# Patient Record
Sex: Male | Born: 1985 | Race: Black or African American | Hispanic: No | Marital: Single | Smoking: Current every day smoker
Health system: Southern US, Community
[De-identification: ages and names within clinical notes are randomized; demographics above are authoritative.]

## PROBLEM LIST (undated history)

## (undated) DIAGNOSIS — E669 Obesity, unspecified: Secondary | ICD-10-CM

## (undated) DIAGNOSIS — I480 Paroxysmal atrial fibrillation: Secondary | ICD-10-CM

## (undated) DIAGNOSIS — K219 Gastro-esophageal reflux disease without esophagitis: Secondary | ICD-10-CM

## (undated) HISTORY — DX: Obesity, unspecified: E66.9

## (undated) HISTORY — PX: OTHER SURGICAL HISTORY: SHX169

## (undated) HISTORY — DX: Paroxysmal atrial fibrillation: I48.0

## (undated) HISTORY — DX: Gastro-esophageal reflux disease without esophagitis: K21.9

---

## 2012-11-22 ENCOUNTER — Emergency Department
Admission: EM | Admit: 2012-11-22 | Disposition: A | Discharge status: Home / Self Care | Payer: Self-pay | Source: Ambulatory Visit | Attending: Emergency Medicine | Admitting: Emergency Medicine

## 2012-11-22 ENCOUNTER — Encounter: Payer: Self-pay | Admitting: Emergency Medicine

## 2012-11-22 MED ORDER — IBUPROFEN 400 MG PO TABS *I*
800.0000 mg | ORAL_TABLET | Freq: Once | ORAL | Status: AC
Start: 2012-11-22 — End: 2012-11-22
  Administered 2012-11-22: 800 mg via ORAL
  Filled 2012-11-22: qty 2

## 2012-11-22 MED ORDER — HYDROCODONE-ACETAMINOPHEN 5-325 MG PO TABS *I*
1.0000 | ORAL_TABLET | Freq: Once | ORAL | Status: AC
Start: 2012-11-22 — End: 2012-11-22
  Administered 2012-11-22: 1 via ORAL
  Filled 2012-11-22: qty 1

## 2012-11-22 NOTE — ED Notes (Signed)
Affirms triage note, Left leg "gave out."  Ambulatory post incident, but presents today bc of significant swelling and pain.  Ambulatory with a limp.  LLE weaker than unaffected side with flexion extension, and edema around entire knee, tender on both medial and lateral aspects.  Patella in place.   Remote Hx of prior L knee injury from basketball, but was Dx as "sprain."  Medicated and stat to XRay.

## 2012-11-22 NOTE — Discharge Instructions (Signed)
You were seen for a left knee twisting injury. There is no fracture, however, it is possible that there is a ligament or meniscus injury. Follow-up with your primary care physician, or sports medicine in one week for re-evaluation.

## 2012-11-22 NOTE — ED Notes (Signed)
Walking to work when he slipped and fell injuring L knee, able to walk but limping. TTP, swollen

## 2012-11-22 NOTE — ED Provider Notes (Addendum)
History     Chief Complaint   Patient presents with   . Knee Pain     HPI Comments: 27 yo M presents with left knee pain and swelling after a knee twisting injury while walking to work. He has been able to ambulate, but only several steps. He denies weakness, numbness. He denies other injury.      History reviewed. No pertinent past medical history.         History reviewed. No pertinent past surgical history.    No family history on file.      Social History      has no tobacco, alcohol, drug, and sexual activity histories on file.    Living Situation    Questions Responses    Patient lives with     Homeless No    Caregiver for other family member     External Services     Employment     Domestic Violence Risk           Review of Systems   Review of Systems   Musculoskeletal:        As in HPI   Skin: Negative for color change and wound.   Neurological: Negative for weakness and numbness.       Physical Exam     ED Triage Vitals   BP Heart Rate Heart Rate(via Pulse Ox) Resp Temp Temp Source SpO2 O2 Device O2 Flow Rate   11/22/12 1945 11/22/12 1945 11/22/12 1945 11/22/12 1945 11/22/12 1945 11/22/12 1945 11/22/12 1945 11/22/12 1945 --   156/91 mmHg 98 98 18 36.3 C (97.3 F) Tympanic 100 % None (Room air)       Weight           11/22/12 1945           104.327 kg (230 lb)               Physical Exam   Nursing note and vitals reviewed.  Constitutional: He is oriented to person, place, and time. He appears well-developed and well-nourished.   HENT:   Head: Normocephalic and atraumatic.   Eyes: Pupils are equal, round, and reactive to light.   Neck: Normal range of motion.   Musculoskeletal:   Left knee with limited ROM secondary to discomfort and obvious knee effusion. Generalized tenderness. No obvious joint laxity.   Neurological: He is alert and oriented to person, place, and time.   Normal sensation and strength   Skin: Skin is warm and dry.       Medical Decision Making      Amount and/or Complexity of Data  Reviewed  Tests in the radiology section of CPT: reviewed and ordered        Initial Evaluation:  ED First Provider Contact    Date/Time Event User Comments    11/22/12 1946 ED Provider First Contact [facility], CYNTHIA M Initial Face to Face Provider Contact          Patient seen by me today 12/14/2012 at 2059    Assessment:  27 y.o., male comes to the ED with left knee pain, effusion, decreased ROM after a twisting injury while walking to work. He otherwise appears well.    Differential Diagnosis includes sprain/strain, doubt fracture, possible ligament or meniscus injury              Plan: xray left knee, PO percocet; plan for knee immobilizer and crutches; sports medicine f/u.      Victorino Dike,  MD    Victorino Dike, MD  Resident  11/22/12 [phone]      Resident Attestation:     Patient seen by me on arrival date of 12/14/2012 at approx 2130    History:   I reviewed this patient, reviewed the resident's note and agree.  Exam:   I examined this patient, reviewed the resident's note and agree.    Decision Making:   I discussed with the resident his/her documented decision making  and agree.      Author Evlyn Kanner, MD      Evlyn Kanner, MD  11/22/12 [phone]

## 2012-11-22 NOTE — First Provider Contact (Signed)
ED Medical Screening Exam Note    Initial provider evaluation performed by   ED First Provider Contact    Date/Time Event User Comments    11/22/12 1946 ED Provider First Contact [facility], Erva Koke M Initial Face to Face Provider Contact            While walking to work injured left knee    XRAYS and ANALGESIA ordered.      Patient requires further evaluation.     Alonzo Loving MARIE [place], NP, 11/22/2012, 7:46 PM

## 2012-12-01 ENCOUNTER — Ambulatory Visit: Payer: Self-pay

## 2013-04-25 ENCOUNTER — Emergency Department
Admission: EM | Admit: 2013-04-25 | Disposition: A | Discharge status: Home / Self Care | Payer: Self-pay | Source: Ambulatory Visit

## 2013-04-25 ENCOUNTER — Encounter: Payer: Self-pay | Admitting: Emergency Medicine

## 2013-04-25 LAB — RAPID STREP SCREEN: Rapid Strep Group A Throat: POSITIVE

## 2013-04-25 MED ORDER — ACETAMINOPHEN-CODEINE 300-30 MG PO TABS *I*
1.0000 | ORAL_TABLET | ORAL | Status: DC | PRN
Start: 2013-04-25 — End: 2018-08-04

## 2013-04-25 MED ORDER — IBUPROFEN 400 MG PO TABS *I*
600.0000 mg | ORAL_TABLET | Freq: Once | ORAL | Status: AC
Start: 2013-04-25 — End: 2013-04-25
  Administered 2013-04-25: 600 mg via ORAL
  Filled 2013-04-25 (×2): qty 1

## 2013-04-25 MED ORDER — LIDOCAINE VISCOUS 2 % MT SOLN *I*
10.0000 mL | Freq: Once | OROMUCOSAL | Status: AC
Start: 2013-04-25 — End: 2013-04-25
  Administered 2013-04-25: 10 mL via OROMUCOSAL
  Filled 2013-04-25: qty 100

## 2013-04-25 MED ORDER — ACETAMINOPHEN-CODEINE 300-30 MG PO TABS *I*
1.0000 | ORAL_TABLET | Freq: Once | ORAL | Status: AC
Start: 2013-04-25 — End: 2013-04-25
  Administered 2013-04-25: 1 via ORAL
  Filled 2013-04-25: qty 1

## 2013-04-25 MED ORDER — PENICILLIN V POTASSIUM 500 MG PO TABS *I*
500.0000 mg | ORAL_TABLET | Freq: Once | ORAL | Status: AC
Start: 2013-04-25 — End: 2013-04-25
  Administered 2013-04-25: 500 mg via ORAL
  Filled 2013-04-25: qty 1

## 2013-04-25 MED ORDER — PENICILLIN V POTASSIUM 500 MG PO TABS *I*
500.0000 mg | ORAL_TABLET | Freq: Two times a day (BID) | ORAL | Status: DC
Start: 2013-04-25 — End: 2018-08-04

## 2013-04-25 NOTE — First Provider Contact (Signed)
ED Medical Screening Exam Note    Initial provider evaluation performed by   ED First Provider Contact    Date/Time Event User Comments    04/25/13 1025 ED Provider First Contact Tericka Devincenzi Initial Face to Face Provider Contact      28 yo male presents with a 2 day history of a sore throat. Denies fever, chills, shortness of breath. Handling secretions. Throat reddened, uvula midline; no exudate notes.         LABS and ANALGESIA ordered.      Patient requires further evaluation.     Conchita Paris, NP, 04/25/2013, 10:25 AM

## 2013-04-25 NOTE — ED Notes (Signed)
PT presented to ED via ambulatory triage. Yesterday had trouble swallowing. Pt states got worse this morning. Has chills. Denies n/v blurred vision and dizziness. NAD.

## 2013-04-25 NOTE — Discharge Instructions (Signed)
Take medicine as prescribed  Follow up with PCP within 5 to 7 days    Don't drink alcohol and/or drive while on medicine  Drink plenty of fluids  Return to ED if you are having increase pain, no improvement and.or worsening symptoms  If you do not have a primary care doctor, the following physicians are currently accepting new patients:    [facility]  [address], [address]  [phone]  Fulton Mole, DO ([place])      [place] [facility]    [facility]  [address]  [phone]  Ulla Potash, MD (IM/Peds)               [facility] (continued)    [facility]  [facility])  [address], [address]  [phone]  Sorour Arnetha Massy, MD (IM)    [facility]  [address] [place]  [phone]  Call for a list of accepting physicians    [facility]  [address] [facility]  Ambulatory Care Facility - 5th floor  [phone]  Call for a list of accepting physicians    [facility]  [address]  [phone]  Jory Sims, MD (IM)    [facility]  [address]  [phone]  Valeta Harms, MD (IM/Peds)     [facility]   NEW Locations:    [facility]  [address] [place], [address]  [phone]  Stoney Bang, MD (FM/OB)  Isaac Laud, MD [facility])  Lorin Picket. Orvan Falconer, MD (FM)               FM: Family Medicine .  IM: Internal Medicine . IM/Peds: Internal Medicine & Pediatrics  [facility]    For physician referral services, please call [phone] or visit [employer]

## 2013-04-25 NOTE — ED Notes (Signed)
Sore throat since yesterday, painful to swallow. In NAD

## 2013-04-25 NOTE — ED Provider Notes (Signed)
History     Chief Complaint   Patient presents with   . Sore Throat     HPI Comments: 27 year old male with no pertinent PMH presents the the ED with 2 days of sore throat and painful swallowing.  Pt reports that yesterday Am he woke up with a sore throat. It has gotten worse so he came in to the ED today. He has pain when her swallows anything, but he can swallow secretions fine.  He rep(orts he feels his throat is more swollen today. He is not having trouble breathing but is less restricted when he breaths through his nose. He denies nasal congestion or post nasal drip.  He was not around any shell fish yesterday.  She reports a fever and chills over the last two days. Denies SOB, chest pian, visual changes, or sublingual swelling.  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         No past surgical history on file.    No family history on file.      Social History      has no tobacco, alcohol, drug, and sexual activity history on file.    Living Situation    Questions Responses    Patient lives with     Homeless No    Caregiver for other family member     External Services     Employment     Domestic Violence Risk           Problem List      does not have a problem list on file.    Review of Systems   Review of Systems   Constitutional: Positive for fever, chills, appetite change and fatigue. Negative for diaphoresis.   HENT: Positive for sore throat and neck pain. Negative for hearing loss, nosebleeds, congestion, drooling, trouble swallowing, neck stiffness, postnasal drip, sinus pressure and tinnitus.    Eyes: Negative for photophobia and redness.   Respiratory: Negative for cough, choking, chest tightness, shortness of breath and stridor.    Cardiovascular: Negative for chest pain, palpitations and leg swelling.    Gastrointestinal: Negative for nausea, vomiting, abdominal pain, diarrhea and blood in stool.   Genitourinary: Negative for urgency, frequency and hematuria.   Musculoskeletal: Negative for back pain, arthralgias and gait problem.   Skin: Negative for color change, pallor, rash and wound.   Allergic/Immunologic: Positive for food allergies.   Neurological: Negative for syncope, speech difficulty, weakness, light-headedness, numbness and headaches.   Psychiatric/Behavioral: Negative.        Physical Exam     ED Triage Vitals   BP Heart Rate Heart Rate(via Pulse Ox) Resp Temp Temp src SpO2 O2 Device O2 Flow Rate   04/25/13 1000 04/25/13 1000 -- 04/25/13 1000 04/25/13 1000 -- 04/25/13 1000 -- --   144/80 mmHg 90  16 37.9 C (100.2 F)  96 %        Weight           --                          Physical Exam   Nursing note and vitals reviewed.  Constitutional: He  is oriented to person, place, and time. Vital signs are normal. He appears well-developed and well-nourished. No distress.   HENT:   Head: Normocephalic and atraumatic. No trismus in the jaw.   Right Ear: External ear normal.   Left Ear: External ear normal.   Mouth/Throat: Uvula is midline. Edematous present. Posterior oropharyngeal erythema present. No oropharyngeal exudate.   Uvula swollen, tonsillar exudates noted bilaterally. oropharynx is erythematous an swollen    Eyes: Conjunctivae and EOM are normal. Pupils are equal, round, and reactive to light. Right eye exhibits no discharge. Left eye exhibits no discharge.   Neck: Normal range of motion. No JVD present. No tracheal deviation present.   Cardiovascular: Normal rate, regular rhythm, normal heart sounds and normal pulses.    No murmur heard.  Pulmonary/Chest: Effort normal and breath sounds normal. No respiratory distress. He exhibits no tenderness.   Abdominal: Soft. Bowel sounds are normal.   Musculoskeletal: Normal range of motion. He exhibits no edema and no tenderness.   Lymphadenopathy:     He  has cervical adenopathy.   Neurological: He is alert and oriented to person, place, and time. He has normal reflexes.   Skin: Skin is warm and dry. No rash noted. No erythema. No pallor.   Psychiatric: He has a normal mood and affect. His behavior is normal. Judgment and thought content normal.       Medical Decision Making      Amount and/or Complexity of Data Reviewed  Clinical lab tests: ordered and reviewed        Initial Evaluation:  ED First Provider Contact    Date/Time Event User Comments    04/25/13 1025 ED Provider First Contact DOYLE, MARGARET Initial Face to Face Provider Contact          Patient seen by me today 04/25/2013 at at time of arrival  1350  Initial face to face evaluation time noted above may be discrepant due to patient acuity and delay in documentation.    Assessment:  27 y.o., male comes to the ED with sore throat x 2 day    Differential Diagnosis includes Strep pharyngitis, viral pharyngitis,               Plan:pain medicine. Abx, dc home  Rapid strep        Alba Cory, PA    Supervising physician Dr.scurek was immediately available      Alba Cory, [place]  04/25/13 1404

## 2016-01-31 ENCOUNTER — Ambulatory Visit
Admission: RE | Admit: 2016-01-31 | Discharge: 2016-01-31 | Disposition: A | Discharge status: Home / Self Care | Payer: Self-pay | Source: Ambulatory Visit | Attending: Psychiatry | Admitting: Psychiatry

## 2016-05-29 ENCOUNTER — Other Ambulatory Visit
Admission: RE | Admit: 2016-05-29 | Discharge: 2016-05-29 | Disposition: A | Discharge status: Home / Self Care | Payer: Self-pay | Source: Ambulatory Visit | Attending: Psychiatry | Admitting: Psychiatry

## 2016-07-07 ENCOUNTER — Other Ambulatory Visit
Admission: RE | Admit: 2016-07-07 | Discharge: 2016-07-07 | Disposition: A | Discharge status: Home / Self Care | Payer: Self-pay | Source: Ambulatory Visit | Admitting: Psychiatry

## 2016-07-22 ENCOUNTER — Other Ambulatory Visit
Admission: RE | Admit: 2016-07-22 | Discharge: 2016-07-22 | Disposition: A | Discharge status: Home / Self Care | Payer: Self-pay | Source: Ambulatory Visit | Admitting: Psychiatry

## 2016-08-04 ENCOUNTER — Other Ambulatory Visit
Admission: RE | Admit: 2016-08-04 | Discharge: 2016-08-04 | Disposition: A | Discharge status: Home / Self Care | Payer: Self-pay | Source: Ambulatory Visit | Admitting: Psychiatry

## 2016-08-11 ENCOUNTER — Other Ambulatory Visit
Admission: RE | Admit: 2016-08-11 | Discharge: 2016-08-11 | Disposition: A | Discharge status: Home / Self Care | Payer: Self-pay | Source: Ambulatory Visit | Admitting: Psychiatry

## 2016-08-18 ENCOUNTER — Other Ambulatory Visit
Admission: RE | Admit: 2016-08-18 | Discharge: 2016-08-18 | Disposition: A | Discharge status: Home / Self Care | Payer: Self-pay | Source: Ambulatory Visit | Admitting: Psychiatry

## 2016-08-19 ENCOUNTER — Other Ambulatory Visit
Admission: RE | Admit: 2016-08-19 | Discharge: 2016-08-19 | Disposition: A | Discharge status: Home / Self Care | Payer: Self-pay | Source: Ambulatory Visit | Admitting: Psychiatry

## 2016-08-25 ENCOUNTER — Other Ambulatory Visit
Admission: RE | Admit: 2016-08-25 | Discharge: 2016-08-25 | Disposition: A | Discharge status: Home / Self Care | Payer: Self-pay | Source: Ambulatory Visit | Admitting: Psychiatry

## 2016-08-26 ENCOUNTER — Other Ambulatory Visit
Admission: RE | Admit: 2016-08-26 | Discharge: 2016-08-26 | Disposition: A | Discharge status: Home / Self Care | Payer: Self-pay | Source: Ambulatory Visit | Admitting: Psychiatry

## 2016-09-01 ENCOUNTER — Other Ambulatory Visit
Admission: RE | Admit: 2016-09-01 | Discharge: 2016-09-01 | Disposition: A | Discharge status: Home / Self Care | Payer: Self-pay | Source: Ambulatory Visit | Admitting: Psychiatry

## 2016-09-09 ENCOUNTER — Other Ambulatory Visit
Admission: RE | Admit: 2016-09-09 | Discharge: 2016-09-09 | Disposition: A | Discharge status: Home / Self Care | Payer: Self-pay | Source: Ambulatory Visit | Admitting: Psychiatry

## 2016-09-15 ENCOUNTER — Other Ambulatory Visit
Admission: RE | Admit: 2016-09-15 | Discharge: 2016-09-15 | Disposition: A | Discharge status: Home / Self Care | Payer: Self-pay | Source: Ambulatory Visit | Admitting: Psychiatry

## 2016-09-22 ENCOUNTER — Other Ambulatory Visit
Admission: RE | Admit: 2016-09-22 | Discharge: 2016-09-22 | Disposition: A | Discharge status: Home / Self Care | Payer: Self-pay | Source: Ambulatory Visit | Admitting: Psychiatry

## 2016-09-29 ENCOUNTER — Other Ambulatory Visit
Admission: RE | Admit: 2016-09-29 | Discharge: 2016-09-29 | Disposition: A | Discharge status: Home / Self Care | Payer: Self-pay | Source: Ambulatory Visit | Admitting: Psychiatry

## 2016-10-06 ENCOUNTER — Other Ambulatory Visit
Admission: RE | Admit: 2016-10-06 | Discharge: 2016-10-06 | Disposition: A | Discharge status: Home / Self Care | Payer: Self-pay | Source: Ambulatory Visit | Admitting: Psychiatry

## 2016-10-14 ENCOUNTER — Other Ambulatory Visit
Admission: RE | Admit: 2016-10-14 | Discharge: 2016-10-14 | Disposition: A | Discharge status: Home / Self Care | Payer: Self-pay | Source: Ambulatory Visit | Admitting: Psychiatry

## 2017-09-25 ENCOUNTER — Other Ambulatory Visit: Payer: Self-pay | Admitting: Cardiology

## 2017-09-25 ENCOUNTER — Emergency Department
Admission: EM | Admit: 2017-09-25 | Discharge: 2017-09-25 | Disposition: A | Discharge status: Home / Self Care | Payer: No Typology Code available for payment source | Source: Ambulatory Visit | Attending: Emergency Medicine | Admitting: Emergency Medicine

## 2017-09-25 ENCOUNTER — Encounter: Payer: Self-pay | Admitting: Emergency Medicine

## 2017-09-25 DIAGNOSIS — R0789 Other chest pain: Secondary | ICD-10-CM

## 2017-09-25 DIAGNOSIS — F17210 Nicotine dependence, cigarettes, uncomplicated: Secondary | ICD-10-CM | POA: Insufficient documentation

## 2017-09-25 DIAGNOSIS — R002 Palpitations: Secondary | ICD-10-CM | POA: Insufficient documentation

## 2017-09-25 DIAGNOSIS — R000 Tachycardia, unspecified: Secondary | ICD-10-CM

## 2017-09-25 DIAGNOSIS — F1290 Cannabis use, unspecified, uncomplicated: Secondary | ICD-10-CM | POA: Insufficient documentation

## 2017-09-25 LAB — CBC AND DIFFERENTIAL
Baso # K/uL: 0.1 10*3/uL (ref 0.0–0.1)
Basophil %: 1.2 %
Eos # K/uL: 0.4 10*3/uL (ref 0.0–0.5)
Eosinophil %: 3.9 %
Hematocrit: 44 % (ref 40–51)
Hemoglobin: 15 g/dL (ref 13.7–17.5)
IMM Granulocytes #: 0 10*3/uL (ref 0.0–0.1)
IMM Granulocytes: 0.3 %
Lymph # K/uL: 2.6 10*3/uL (ref 1.3–3.6)
Lymphocyte %: 28.7 %
MCH: 30 pg (ref 26–32)
MCHC: 34 g/dL (ref 32–37)
MCV: 87 fL (ref 79–92)
Mono # K/uL: 0.7 10*3/uL (ref 0.3–0.8)
Monocyte %: 7.9 %
Neut # K/uL: 5.3 10*3/uL (ref 1.8–5.4)
Nucl RBC # K/uL: 0 10*3/uL (ref 0.0–0.0)
Nucl RBC %: 0 /100 WBC (ref 0.0–0.2)
Platelets: 295 10*3/uL (ref 150–330)
RBC: 5 MIL/uL (ref 4.6–6.1)
RDW: 14 % (ref 11.6–14.4)
Seg Neut %: 58 %
WBC: 9.1 10*3/uL (ref 4.2–9.1)

## 2017-09-25 LAB — BASIC METABOLIC PANEL
Anion Gap: 11 (ref 7–16)
CO2: 25 mmol/L (ref 20–28)
Calcium: 9 mg/dL (ref 9.0–10.3)
Chloride: 102 mmol/L (ref 96–108)
Creatinine: 0.97 mg/dL (ref 0.67–1.17)
GFR,Black: 118 *
GFR,Caucasian: 102 *
Glucose: 116 mg/dL — ABNORMAL HIGH (ref 60–99)
Lab: 12 mg/dL (ref 6–20)
Potassium: 3.6 mmol/L (ref 3.3–5.1)
Sodium: 138 mmol/L (ref 133–145)

## 2017-09-25 LAB — MAGNESIUM: Magnesium: 1.5 mEq/L (ref 1.3–2.1)

## 2017-09-25 NOTE — ED Provider Notes (Signed)
History     Chief Complaint   Patient presents with    Palpitations     HPI Comments: Patient is a 31 y/o male with no known PMH who presents today with concern of palpitations.  He states that just prior to arrival, he smoked some marijuana and stood up abruptly after smoking this.  He states that that time, he felt lightheaded as well as noted some palpitations.  He also noted some burning type chest discomfort.  He states that all of his symptoms resolved as soon as he sat down.  He denies any similar symptoms in the past.  He denies any associated diaphoresis, nausea, shortness of breath, chest pressure, radiation of pain, syncope, fall, injury or trauma, fevers, chills, visual changes, auditory changes, numbness, tingling, or headaches.  He states he has not taken anything for his symptoms and they self resolved.  Patient denies any personal or family history of cardiac events.      History provided by:  Patient  Language interpreter used: No        Medical/Surgical/Family History     History reviewed. No pertinent past medical history.     There is no problem list on file for this patient.           History reviewed. No pertinent surgical history.  History reviewed. No pertinent family history.       Social History   Substance Use Topics    Smoking status: Current Every Day Smoker    Smokeless tobacco: Never Used    Alcohol use No     Living Situation     Questions Responses    Patient lives with Spouse    Homeless No    Caregiver for other family member     External Services     Employment Disabled    Domestic Violence Risk                 Review of Systems   Review of Systems   Constitutional: Negative for activity change, appetite change, chills, diaphoresis, fatigue and fever.   HENT: Negative for congestion, ear pain, rhinorrhea, sinus pressure, sneezing and sore throat.    Eyes: Negative for visual disturbance.   Respiratory: Negative for apnea, cough, choking, chest tightness and shortness of  breath.    Cardiovascular: Negative for chest pain (Burning discomfort, resolved), palpitations (Resolved) and leg swelling.   Gastrointestinal: Negative for abdominal distention, abdominal pain, constipation, diarrhea, nausea and vomiting.   Genitourinary: Negative for difficulty urinating, dysuria, flank pain, frequency, hematuria and urgency.   Musculoskeletal: Negative for neck pain and neck stiffness.   Skin: Negative for color change, pallor, rash and wound.   Neurological: Negative for dizziness, tremors, syncope, weakness, light-headedness (Resolved), numbness and headaches.       Physical Exam     Triage Vitals  Triage Start: Start, (09/25/17 2120)   First Recorded BP: 165/84, Resp: 16, Temp: 37.2 C (99 F) Oxygen Therapy SpO2: 97 %, O2 Device: None (Room air),   Heart Rate (via Pulse Ox): (!) 124, (09/25/17 2122).  First Pain Reported  0-10 Scale: 6, (09/25/17 2122)       Physical Exam   Constitutional: He is oriented to person, place, and time. He appears well-developed. No distress.   HENT:   Head: Normocephalic and atraumatic.   Right Ear: External ear normal.   Left Ear: External ear normal.   Nose: Nose normal.   Eyes: Conjunctivae and EOM are normal.  Neck: Normal range of motion. Neck supple.   Cardiovascular: Normal rate, regular rhythm, normal heart sounds and intact distal pulses.  Exam reveals no gallop and no friction rub.    No murmur heard.  Pulmonary/Chest: Effort normal and breath sounds normal. No respiratory distress. He has no wheezes. He has no rales. He exhibits no tenderness.   Abdominal: Soft. Bowel sounds are normal. He exhibits no distension and no mass. There is no tenderness. There is no rebound and no guarding.   Musculoskeletal: Normal range of motion. He exhibits no edema, tenderness or deformity.   Neurological: He is alert and oriented to person, place, and time. No cranial nerve deficit. He exhibits normal muscle tone. Coordination normal.   Skin: Skin is warm and dry. No  rash noted. He is not diaphoretic. No erythema. No pallor.   Psychiatric: He has a normal mood and affect. His behavior is normal. Judgment and thought content normal.   Nursing note and vitals reviewed.      Medical Decision Making      Amount and/or Complexity of Data Reviewed  Clinical lab tests: ordered and reviewed  Decide to obtain previous medical records or to obtain history from someone other than the patient: yes  Review and summarize past medical records: yes  Independent visualization of images, tracings, or specimens: yes        Initial Evaluation:  ED First Provider Contact     Date/Time Event User Comments    09/25/17 2220 ED First Provider Contact Theressa MillardGARTH, [facility] Initial Face to Face Provider Contact          Patient seen by me on arrival date of 09/25/2017.    Assessment:  30 y.o.male comes to the ED with concern of an episode of lightheadedness and palpitations associated with a burning in the middle of his chest which occurred after he stood up quickly after smoking marijuana.  No other associated symptoms at the time, and no ongoing symptoms.  On exam, there are no significant abnormal findings.  Initially, the patient was tachycardic, but at exam, the patient's heart rate was in the low 80s.  No tenderness with palpation of any portion of the patient's anterior chest wall.      Differential Diagnosis includes: Orthostatic hypotension, drug reaction, GERD, unlikely PE/ACS        Plan: Diagnostic:   Labs Reviewed   BASIC METABOLIC PANEL - Abnormal; Notable for the following:        Result Value    Glucose 116 (*)     All other components within normal limits   CBC AND DIFFERENTIAL   MAGNESIUM     I have discussed the option of both obtaining further testing including imaging as well as foregoing this, as well as the benefits and risks of either decision.  I state that I can not definitely exclude any underlying abnormalities without further testing.  Patient verbalizes understanding of risks and  benefits of both decisions and does not wish to pursue further assessment.       Therapeutic: None indicated.  Patient states that he is feeling well with no symptoms.    I discussed the results with the patient.  I addressed all questions and concerns.  The patient verbalized understanding and agreement with outpatient conservative management and close follow up with PCP.  Reviewed strict return precautions.     I have reassessed this patient several times during time in the ED.    Plan to discharge pt  home with instructions to follow-up with PCP  Pt was instructed to return to the ED with worsening symptoms or concerns   Pt is agreeable with this plan and understands return precautions     This document was dictated with Dragon voice recognition software. Please excuse any errors as the document was typeread to the best of my ability.        Lynann Bologna, PA    Supervising physician [employer] was immediately available        Lynann Bologna, [place]  09/25/17 2333

## 2017-09-25 NOTE — ED Triage Notes (Signed)
Patient reports having left sided chest discomfort and it feels like his heart is racing denies any nausea denies any SOB.       Triage Note   Marney Settinganielle Zarria Towell, RN

## 2017-09-25 NOTE — ED Notes (Signed)
Pt to ED for L sided chest pain that started 20 min PTA., pt reports pain being intermittent and not experiencing pain at this moment however feels discomfort, denies pain effecting ability to breathe or feel SOB. Pt reports smoking weed awhile ago and states " hope i'm not just high thinking something is wrong". Pt having difficulty describing quality and nature of pain, stating "I guess it feels like pressure". Pt placed on tele, vitals stable.

## 2017-09-25 NOTE — Discharge Instructions (Signed)
You were seen in Emergency Department today for assessment; all labs reassuring.    Please continue to closely monitor your symptoms and ensure that you are staying well hydrated.     You need to follow up with your primary care physician, please call within 48 hours to schedule an appointment to be seen in 3-5 days.     Return to the Emergency Department if your condition worsens.    If you were given narcotic pain medication today or any medication that made you feel drowsy, do not drive or operate machinery for 24 hours.

## 2017-09-29 LAB — EKG 12-LEAD
P: 43 deg
PR: 156 ms
QRS: 30 deg
QRSD: 86 ms
QT: 312 ms
QTc: 426 ms
Rate: 112 {beats}/min
Severity: ABNORMAL
Statement: BORDERLINE
T: 31 deg

## 2018-07-01 ENCOUNTER — Other Ambulatory Visit: Payer: Self-pay | Admitting: Cardiology

## 2018-07-01 DIAGNOSIS — F17210 Nicotine dependence, cigarettes, uncomplicated: Secondary | ICD-10-CM | POA: Insufficient documentation

## 2018-07-01 DIAGNOSIS — R0789 Other chest pain: Secondary | ICD-10-CM

## 2018-07-01 DIAGNOSIS — R079 Chest pain, unspecified: Secondary | ICD-10-CM | POA: Insufficient documentation

## 2018-07-01 NOTE — ED Triage Notes (Signed)
EKG done at triage.        Triage Note   Rydell Wiegel, RN

## 2018-07-01 NOTE — ED Triage Notes (Signed)
Pt has left sided chest pain  "Like someone is sitting on my chest "radiating to left arm  X 8 hours. No nausea.        Triage Note   Jennefer BravoSuzanne Jaimie Redditt, RN

## 2018-07-02 ENCOUNTER — Emergency Department
Admission: EM | Admit: 2018-07-02 | Discharge: 2018-07-02 | Disposition: A | Discharge status: Home / Self Care | Payer: No Typology Code available for payment source | Source: Ambulatory Visit | Attending: Emergency Medicine | Admitting: Emergency Medicine

## 2018-07-02 ENCOUNTER — Encounter: Payer: Self-pay | Admitting: Emergency Medicine

## 2018-07-02 ENCOUNTER — Emergency Department: Payer: No Typology Code available for payment source

## 2018-07-02 DIAGNOSIS — R079 Chest pain, unspecified: Secondary | ICD-10-CM

## 2018-07-02 DIAGNOSIS — I499 Cardiac arrhythmia, unspecified: Secondary | ICD-10-CM

## 2018-07-02 LAB — CBC AND DIFFERENTIAL
Baso # K/uL: 0.1 10*3/uL (ref 0.0–0.1)
Basophil %: 1.1 %
Eos # K/uL: 0.6 10*3/uL — ABNORMAL HIGH (ref 0.0–0.5)
Eosinophil %: 6.9 %
Hematocrit: 45 % (ref 40–51)
Hemoglobin: 15.3 g/dL (ref 13.7–17.5)
IMM Granulocytes #: 0 10*3/uL
IMM Granulocytes: 0.3 %
Lymph # K/uL: 2.9 10*3/uL (ref 1.3–3.6)
Lymphocyte %: 36.6 %
MCH: 29 pg (ref 26–32)
MCHC: 34 g/dL (ref 32–37)
MCV: 87 fL (ref 79–92)
Mono # K/uL: 0.7 10*3/uL (ref 0.3–0.8)
Monocyte %: 8.3 %
Neut # K/uL: 3.7 10*3/uL (ref 1.8–5.4)
Nucl RBC # K/uL: 0 10*3/uL (ref 0.0–0.0)
Nucl RBC %: 0 /100 WBC (ref 0.0–0.2)
Platelets: 332 10*3/uL — ABNORMAL HIGH (ref 150–330)
RBC: 5.2 MIL/uL (ref 4.6–6.1)
RDW: 13.6 % (ref 11.6–14.4)
Seg Neut %: 46.8 %
WBC: 8 10*3/uL (ref 4.2–9.1)

## 2018-07-02 LAB — BASIC METABOLIC PANEL
Anion Gap: 10 (ref 7–16)
CO2: 25 mmol/L (ref 20–28)
Calcium: 9.1 mg/dL (ref 9.0–10.3)
Chloride: 103 mmol/L (ref 96–108)
Creatinine: 0.84 mg/dL (ref 0.67–1.17)
GFR,Black: 132 *
GFR,Caucasian: 115 *
Glucose: 147 mg/dL — ABNORMAL HIGH (ref 60–99)
Lab: 10 mg/dL (ref 6–20)
Potassium: 3.9 mmol/L (ref 3.3–5.1)
Sodium: 138 mmol/L (ref 133–145)

## 2018-07-02 LAB — TROPONIN T 3 HR W/ DELTA HIGH SENSITIVITY (IP/ED ONLY)
HS TROP % Change: 0 % (ref 0–20)
TROP T 0-3 HR DELTA High Sensitivity: 0 (ref 0–11)
TROP T 3 HR High Sensitivity: 6 ng/L (ref 0–21)

## 2018-07-02 LAB — TROPONIN T 0 HR HIGH SENSITIVITY (IP/ED ONLY): TROP T 0 HR High Sensitivity: 6 ng/L (ref 0–21)

## 2018-07-02 MED ORDER — SODIUM CHLORIDE 0.9 % FLUSH FOR PUMPS *I*
0.0000 mL/h | INTRAVENOUS | Status: DC | PRN
Start: 2018-07-02 — End: 2018-07-02

## 2018-07-02 MED ORDER — DEXTROSE 5 % FLUSH FOR PUMPS *I*
0.0000 mL/h | INTRAVENOUS | Status: DC | PRN
Start: 2018-07-02 — End: 2018-07-02

## 2018-07-02 NOTE — ED Notes (Signed)
Pt is a&o. Ambulatory at time of d/c. Discharge instructions given to pt who verbalizes understanding.

## 2018-07-02 NOTE — ED Provider Notes (Signed)
History     Chief Complaint   Patient presents with    Chest Pain     Patient is a 32 year old male with no significant past history, smokes approximately 10 cigarettes a day who presents emergency Department with left-sided chest pain.  He states that today at approximately 3:00PM he had left sided chest pressure that radiated down his left arm and then again at 10:30PM. He deneis any diaphoresis, dyspnea, cough, leg swelling, palpitations. He states both of his parents have significant cardiac histories.        History provided by:  Patient and medical records  Language interpreter used: No        Medical/Surgical/Family History     History reviewed. No pertinent past medical history.     There is no problem list on file for this patient.           History reviewed. No pertinent surgical history.  No family history on file.       Social History   Substance Use Topics    Smoking status: Current Every Day Smoker    Smokeless tobacco: Never Used    Alcohol use No     Living Situation     Questions Responses    Patient lives with Spouse    Homeless No    Caregiver for other family member     External Services     Employment Disabled    Domestic Violence Risk                 Review of Systems   Review of Systems   Constitutional: Negative for chills, diaphoresis and fever.   Respiratory: Negative for cough and shortness of breath.    Cardiovascular: Positive for chest pain. Negative for palpitations and leg swelling.   Gastrointestinal: Negative for abdominal pain, diarrhea, nausea and vomiting.   Musculoskeletal: Negative for back pain.   Neurological: Negative for weakness, light-headedness, numbness and headaches.   All other systems reviewed and are negative.      Physical Exam     Triage Vitals      First Recorded BP: 131/72, Resp: 15, Temp: 36.3 C (97.3 F), Temp src: TEMPORAL Oxygen Therapy SpO2: 100 %, Oximetry Source: Lt Hand, O2 Device: None (Room air), Heart Rate: 82, (07/01/18 2243) Heart Rate (via  Pulse Ox): 65, (07/02/18 0100).  First Pain Reported  0-10 Scale: 7, Pain Location/Orientation: Chest, (07/01/18 2243)       Physical Exam   Constitutional: He is oriented to person, place, and time. He appears well-developed. No distress.   HENT:   Head: Normocephalic and atraumatic.   Eyes: Conjunctivae are normal.   Neck: Normal range of motion. Neck supple.   Cardiovascular: Normal rate, regular rhythm and normal heart sounds.    Pulmonary/Chest: Effort normal and breath sounds normal.   No chest wall tenderness with palpation   Abdominal: Soft. There is no tenderness.   Musculoskeletal: Normal range of motion.   No lower extremity edema or calf tenderness bilaterally   Neurological: He is alert and oriented to person, place, and time.   Skin: Skin is warm and dry.   Nursing note and vitals reviewed.      Medical Decision Making   Patient seen by me on:  07/01/2018    Assessment:  Patient is a [age] year old male with a history of tobacco use who presents emergency Department with 2 episodes of left-sided chest pain.    Differential diagnosis:  Chostrochodritis, chest wall  pain, pleurisy, unstable angina, acute pericarditis, atypical chest pain, acute MI    Plan:  CBC, BMP, trop, EKG, tele, CXR    EKG Interpretation: normal sinus rythm, no ischemic changes    ED Course and Disposition:  Labs Reviewed  CBC AND DIFFERENTIAL - Abnormal; Notable for the following:      Platelets                     332 (*)                Eos # K/uL                    0.6 (*)             All other components within normal limits  BASIC METABOLIC PANEL - Abnormal; Notable for the following:      Glucose                       147 (*)             All other components within normal limits  TROPONIN T 0 HR HIGH SENSITIVITY (IP/ED ONLY)  TROPONIN T 3 HR W/ DELTA HIGH SENSITIVITY (IP/ED ONLY)  Patient updated of their results, they are agreeable with plan for discharge with PCP referral. Return precautions discussed and they will return to the  ED if their condition worsens.              Carmela HurtLindsey Rae Caleel Kiner, PA          Remonia RichterSchleyer, Larrisa Cravey [place]ae, [place]  07/02/18 [phone]

## 2018-07-02 NOTE — ED Notes (Signed)
Pt. to ED d/t left sided chest pain/breast pain that started approximately about 8 hours ago when he was moving something at work. He complains of left elbow pain. He states that the pain comes and goes that feels more like pressure. Pt. Denies lightheadedness, shortness of breath, or N/V.

## 2018-07-02 NOTE — Discharge Instructions (Signed)
Your blood work, EKG and chest x ray were reassuring. You have been referred to a primary care physician. You will receive a phone call within 3-4 business days to establish care, you may schedule an appointment as needed at that time. Review the attached information thoroughly. Do not hesitate to return to the emergency department if your condition worsens.

## 2018-07-10 LAB — EKG 12-LEAD
P: 69 deg
PR: 171 ms
QRS: 45 deg
QRSD: 93 ms
QT: 373 ms
QTc: 403 ms
Rate: 70 {beats}/min
T: 60 deg

## 2018-07-20 ENCOUNTER — Other Ambulatory Visit: Payer: Self-pay | Admitting: Cardiology

## 2018-07-20 ENCOUNTER — Emergency Department: Payer: No Typology Code available for payment source

## 2018-07-20 ENCOUNTER — Encounter: Payer: Self-pay | Admitting: Emergency Medicine

## 2018-07-20 ENCOUNTER — Emergency Department
Admission: EM | Admit: 2018-07-20 | Discharge: 2018-07-20 | Disposition: A | Discharge status: Home / Self Care | Payer: No Typology Code available for payment source | Source: Ambulatory Visit | Attending: Emergency Medicine | Admitting: Emergency Medicine

## 2018-07-20 DIAGNOSIS — F17210 Nicotine dependence, cigarettes, uncomplicated: Secondary | ICD-10-CM | POA: Insufficient documentation

## 2018-07-20 DIAGNOSIS — I499 Cardiac arrhythmia, unspecified: Secondary | ICD-10-CM

## 2018-07-20 DIAGNOSIS — R0789 Other chest pain: Secondary | ICD-10-CM

## 2018-07-20 DIAGNOSIS — M791 Myalgia, unspecified site: Secondary | ICD-10-CM

## 2018-07-20 LAB — BASIC METABOLIC PANEL
Anion Gap: 12 (ref 7–16)
CO2: 25 mmol/L (ref 20–28)
Calcium: 9.4 mg/dL (ref 9.0–10.3)
Chloride: 103 mmol/L (ref 96–108)
Creatinine: 0.89 mg/dL (ref 0.67–1.17)
GFR,Black: 129 *
GFR,Caucasian: 112 *
Glucose: 118 mg/dL — ABNORMAL HIGH (ref 60–99)
Lab: 8 mg/dL (ref 6–20)
Potassium: 4.1 mmol/L (ref 3.3–5.1)
Sodium: 140 mmol/L (ref 133–145)

## 2018-07-20 LAB — TROPONIN T 3 HR W/ DELTA HIGH SENSITIVITY (IP/ED ONLY): TROP T 3 HR High Sensitivity: <6 ng/L (ref 0–21)

## 2018-07-20 LAB — CBC AND DIFFERENTIAL
Baso # K/uL: 0.1 10*3/uL (ref 0.0–0.1)
Basophil %: 1.1 %
Eos # K/uL: 0.5 10*3/uL (ref 0.0–0.5)
Eosinophil %: 6.2 %
Hematocrit: 44 % (ref 40–51)
Hemoglobin: 14.8 g/dL (ref 13.7–17.5)
IMM Granulocytes #: 0 10*3/uL
IMM Granulocytes: 0.3 %
Lymph # K/uL: 2 10*3/uL (ref 1.3–3.6)
Lymphocyte %: 27 %
MCH: 29 pg (ref 26–32)
MCHC: 34 g/dL (ref 32–37)
MCV: 86 fL (ref 79–92)
Mono # K/uL: 0.6 10*3/uL (ref 0.3–0.8)
Monocyte %: 8.7 %
Neut # K/uL: 4.2 10*3/uL (ref 1.8–5.4)
Nucl RBC # K/uL: 0 10*3/uL (ref 0.0–0.0)
Nucl RBC %: 0 /100 WBC (ref 0.0–0.2)
Platelets: 311 10*3/uL (ref 150–330)
RBC: 5.1 MIL/uL (ref 4.6–6.1)
RDW: 13.5 % (ref 11.6–14.4)
Seg Neut %: 56.7 %
WBC: 7.4 10*3/uL (ref 4.2–9.1)

## 2018-07-20 LAB — RUQ PANEL (ED ONLY)
ALT: 28 U/L (ref 0–50)
AST: 21 U/L (ref 0–50)
Albumin: 4.2 g/dL (ref 3.5–5.2)
Alk Phos: 65 U/L (ref 40–130)
Amylase: 63 U/L (ref 28–100)
Bilirubin,Direct: <0.2 mg/dL (ref 0.0–0.3)
Bilirubin,Total: 0.5 mg/dL (ref 0.0–1.2)
Globulin: 2.8 g/dL (ref 2.7–4.3)
Lipase: 24 U/L (ref 13–60)
Total Protein: 7 g/dL (ref 6.3–7.7)

## 2018-07-20 LAB — HOLD GREEN WITH GEL

## 2018-07-20 LAB — TROPONIN T 0 HR HIGH SENSITIVITY (IP/ED ONLY): TROP T 0 HR High Sensitivity: <6 ng/L (ref 0–21)

## 2018-07-20 LAB — HM HIV SCREENING OFFERED

## 2018-07-20 MED ORDER — ASPIRIN 81 MG PO CHEW *I*
324.0000 mg | CHEWABLE_TABLET | Freq: Once | ORAL | Status: AC
Start: 2018-07-20 — End: 2018-07-20
  Administered 2018-07-20: 324 mg via ORAL
  Filled 2018-07-20: qty 4

## 2018-07-20 MED ORDER — DEXTROSE 5 % FLUSH FOR PUMPS *I*
0.0000 mL/h | INTRAVENOUS | Status: DC | PRN
Start: 2018-07-20 — End: 2018-07-20

## 2018-07-20 MED ORDER — SODIUM CHLORIDE 0.9 % FLUSH FOR PUMPS *I*
0.0000 mL/h | INTRAVENOUS | Status: DC | PRN
Start: 2018-07-20 — End: 2018-07-20

## 2018-07-20 NOTE — ED Notes (Signed)
Pt to xray

## 2018-07-20 NOTE — Discharge Instructions (Addendum)
You were seen in the Emergency Room for chest pressure and left arm pain.   Your cardiac tests were all reassuring, and your chest X-ray was normal.   Since this is the second time you've had this episode this month, we recommend you follow up with a primary care doctor to consider if further tests are needed. The symptoms may also be muscular or related to acid reflux, which can sometimes cause similar symptoms in the chest.     If you develop difficulty breathing, worsening chest pain, fainting, severe lightheadedness, or other new concerning symptoms - please return for re-evaluation.     During your ED visit a [employer] is available to assist you with information regarding your health care needs. Please find any follow up appointments listed below.  If there are any questions or concerns please contact community health worker for assistance.   James Price  [facility] Worker  [phone]

## 2018-07-20 NOTE — ED Notes (Signed)
ED RN INTERN ATTESTATION       I Bronwyn Belasco Malen Gauze, RN (RN) reviewed the following charting information by the RN intern: James Bamberg RN    Nursing Assessments  Medications  Plan of Care  Teaching   Notes    In the chart of James Price (32 y.o. male) and attest to the charting being accurate.

## 2018-07-20 NOTE — Comprehensive Assessment (Signed)
07/20/18 1217   DSRIP intervention status   DSRIP Intervention status Initial intervention   Patient Barriers no PCP   Patient address confirmation Yes   Patient phone confirmation Yes   Patient insurance confirmation Yes   Appt type New patient appt-PCP   Appointment center/address: [place], [place]   Time Spent with Patient (min) 10     CHW met with patient about getting connected with PCP. Patient was interested, CHW scheduled a new patient visit appt and will be in patient AVS.    [place] Worker  [phone]  07/20/2018 12:21 PM

## 2018-07-20 NOTE — ED Provider Notes (Signed)
History     Chief Complaint   Patient presents with    Chest Pain     Pt present to ED with chest pain. pt describes it as a burning sensation. Pt recently seen at [facility] ED ad D/c'd at 7 am. Pt states symptoms have not resolved.      32 year old male, 59yr  smoking history, presents for evaluation of chest pressure.  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  Patient states he's been having pain in the left arm for months now that he attributes to overuse at work. He denies any associated SOB, diaphoresis, dizziness or lightheadedness. The chest pressure did not radiate, lasted about 1 hour, and is now resolved.   He had not taken anything for his symptoms.        History provided by:  Patient      Medical/Surgical/Family History     History reviewed. No pertinent past medical history.     There is no problem list on file for this patient.           History reviewed. No pertinent surgical history.  No family history on file.       Social History   Substance Use Topics    Smoking status: Current Every Day Smoker    Smokeless tobacco: Never Used    Alcohol use No     Living Situation     Questions Responses    Patient lives with Spouse    Homeless No    Caregiver for other family member     External Services     Employment Disabled    Domestic Violence Risk                 Review of Systems   Review of Systems   Constitutional: Negative for chills and fever.   HENT: Negative for congestion.    Eyes: Negative for visual disturbance.   Respiratory: Negative for cough and shortness of breath.    Cardiovascular: Positive for chest pain. Negative for palpitations.   Gastrointestinal: Negative for abdominal pain, nausea and vomiting.   Musculoskeletal: Positive for myalgias.   Skin: Negative for color change.   Neurological: Negative for dizziness and light-headedness.       Physical Exam     Triage Vitals  Triage  Start: Start, (07/20/18 1110)   First Recorded BP: 144/79, Temp: 36.3 C (97.3 F), Temp src: TEMPORAL Oxygen Therapy SpO2: 99 %, Oximetry Source: Rt Hand, O2 Device: None (Room air), Heart Rate: 87, (07/20/18 1113)  .  First Pain Reported  0-10 Scale: 8, Pain Location/Orientation: Chest, (07/20/18 1113)       Physical Exam   Constitutional: He is oriented to person, place, and time. He appears well-developed and well-nourished. No distress.   HENT:   Head: Normocephalic and atraumatic.   Eyes: Conjunctivae are normal.   Neck: Neck supple.   Cardiovascular: Normal rate, regular rhythm and normal heart sounds.    Pulmonary/Chest: Effort normal and breath sounds normal.   Abdominal: Soft. He exhibits no distension. There is no tenderness.   Musculoskeletal: He exhibits no edema or deformity.   Neurological: He is alert and oriented to person, place, and time.   Moves all 4 extremities without focal neurologic deficits   Skin: Skin is warm and dry.   Psychiatric: He has a normal mood and affect. His behavior is  normal.   Nursing note and vitals reviewed.      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  He denies any EtOH or cocaine use.  He is a smoker, approximately half a pack a day for the last 15 years, otherwise no past medical history.    Differential diagnosis:  R/o ACS  Esophageal spasm, GERD  MSK pain    Plan:  Orders Placed This Encounter      *Chest standard frontal and lateral views      CBC and differential      Basic metabolic panel      Hold green with gel      RUQ panel (ED only)      Troponin T 3 HR W/ Delta High Sensitivity      Troponin T 0 HR High Sensitivity      EKG 12 lead (initial)      EKG:  initial      EKG: follow up      HM HIV SCREENING OFFERED      Insert peripheral IV      EKG Interpretation: normal sinus rythm, no ischemic changes    Independent  review of: Existing labs, XRays    ED Course and Disposition:  Labs are reassuring, chest x-ray normal, EKG is normal x 2.   Patient pain free throughout ED visit.  PCP appointment set up for 5 days from now for follow-up.  Stable for discharge home at this time with return precautions.  He is agreeable with plan.            Concha Se, MD          Concha Se, MD  07/20/18 [phone]

## 2018-07-20 NOTE — ED Notes (Addendum)
Pt presents to ED with chest pressure and burning in his chest. Pt states " I feel like someone is pushing down on my chest.". Pt states when he moves his arm up and holds it up the chest pressure goes away. Pt states the chest pressure has been going on for about two months. Pt states the pain comes and goes. Pt states the burning comes once to twice a week.Pt denies shortness of breath. Pt states he does have new life stressors in his life. Pt stated" my baby mama moved out of town with my son and I have a very demanding job.". Pt states he vomited today for the first time. Pt states he sometimes gets the chest pressure after eating.Pt A&Ox4. Pt on tele. Pt in gown. Call light within reach.

## 2018-07-20 NOTE — ED Triage Notes (Signed)
Pt present to ED with chest pain. pt describes it as a burning sensation. Pt recently seen at [facility] ED ad D/c'd at 7 am. Pt states symptoms have not resolved.     Triage Note   James Price

## 2018-07-22 LAB — EKG 12-LEAD
P: 46 deg
P: 10 deg
PR: 163 ms
PR: 178 ms
QRS: 27 deg
QRS: 40 deg
QRSD: 89 ms
QRSD: 88 ms
QT: 367 ms
QT: 396 ms
QTc: 426 ms
QTc: 415 ms
Rate: 81 {beats}/min
Rate: 66 {beats}/min
T: 31 deg
T: 46 deg

## 2018-07-25 ENCOUNTER — Ambulatory Visit
Payer: No Typology Code available for payment source | Admitting: Student in an Organized Health Care Education/Training Program

## 2018-08-04 ENCOUNTER — Encounter: Payer: Self-pay | Admitting: Surgery

## 2018-08-04 ENCOUNTER — Ambulatory Visit: Payer: No Typology Code available for payment source | Attending: Surgery | Admitting: Surgery

## 2018-08-04 VITALS — BP 132/64 | HR 90 | Temp 97.5°F | Ht 68.9 in | Wt 258.0 lb

## 2018-08-04 DIAGNOSIS — Z7689 Persons encountering health services in other specified circumstances: Secondary | ICD-10-CM

## 2018-08-04 DIAGNOSIS — M25512 Pain in left shoulder: Secondary | ICD-10-CM

## 2018-08-04 DIAGNOSIS — R0789 Other chest pain: Secondary | ICD-10-CM

## 2018-08-04 DIAGNOSIS — Z0000 Encounter for general adult medical examination without abnormal findings: Secondary | ICD-10-CM

## 2018-08-04 NOTE — Progress Notes (Signed)
[facility]: Adult Physical    HPI  The patient reports after ED  visit as new pt.    New pt to [facility], has not had regular medical care in > 10 years.  He is thinking of moving to [place] soon, where his son is living .    Was seen at [facility] ED on 07/20/18 for c/o CP and pressure.  Evaluation included CXR, EKG, and blood work; all WNL  CP was thought to be either due to increased stress or existing L shoulder pain.  -pain now decreased since being evaluated in ED  Was on vacation last week and no pain x 5 days.    L shoulder-   -pain started 1 month ago without a known injury  -intermittent pain, aggravated by movement or weight bearing.  -takes ASA with mod effect.    Current Smoker: yes, 1/2 ppd    There is no problem list on file for this patient.     History reviewed. No pertinent past medical history.   Past Surgical History:   Procedure Laterality Date    ORIF right tibia Right      No current outpatient prescriptions on file.     No current facility-administered medications for this visit.      Allergies   Allergen Reactions    Shellfish-Derived Products Itching     Family History   Problem Relation Age of Onset    Heart Disease Mother     High Blood Pressure Mother     Substance abuse Mother     No Known Problems Sister     No Known Problems Brother     Substance abuse Maternal Grandmother      Social History     Social History    Marital status: Single     Spouse name: N/A    Number of children: 3    Years of education: N/A     Occupational History    warehouse work      FT     Social History Main Topics    Smoking status: Current Every Day Smoker     Packs/day: 0.50     Types: Cigarettes    Smokeless tobacco: Never Used    Alcohol use Yes      Comment: 1-2 times month    Drug use: No      Comment: previous use x 3 weeks    Sexual activity: Yes     Partners: Female     Social History Narrative    Lives with Dad         Safety  Wears seatbelt? yes  Wears cycling helmet? yes  Smoke  detectors? yes  Carbon monoxide detectors? yes  Uses sunscreen? no  Adequate housing? yes  Feel safe at home? yes    Health Maintenance  Dental care? Yes, last appt 1 year  Vision screening?  wears glasses. Needs exam  Hearing screening?  n/a  Lipids? Ordered today  Diabetes screening?  ordered today      ROS  CONSTITUTIONAL: Appetite normal.  Denies fevers, night sweats or weight loss   CV: No chest pain, palpitations, shortness of breath or peripheral edema   RESPIRATORY: No cough, wheezing or dyspnea   GI: No nausea/vomiting, abdominal pain, or change in bowel habits   GU: No dysuria, urgency or incontinence   NEURO: No MS changes, no motor weakness, no sensory changes   MUSCULOSKELETAL- L shoulder pain, intermittent    PHYSICAL  EXAM  BP 132/64    Pulse 90    Temp 36.4 C (97.5 F) (Temporal)    Ht 1.75 m (5' 8.9")    Wt 117 kg (258 lb)    BMI 38.21 kg/m   GENERAL APPEARANCE: Appears stated age, well appearing, NAD   HEENT: PERRL, EOMI, TMs normal, oropharynx clear   LUNGS: Clear to auscultation and percussion   HEART: Normal rate, regular rhythm, with normal S1 & S2.  No murmurs, gallops, or rubs.  ABDOMEN: Normoactive BS, soft, non-tender, without organomegaly.   EXTREMITIES: warm, well-perfused, without clubbing, cyanosis, or edema   NEUROLOGIC: Alert and oriented x3, cranial nerves II-XII intact, motor/sensory exam normal, DTRs symmetric, normal gait   Musculoskeletal- L shoulder with FROM, no pain with palpation or movement  ASSESSMENT  Well 32 y.o. year old  New pt    PLAN     1. Well adult exam  New pt today  Declines influenza vaccine today.  Will check routine blood work when able.  - Lipid add Rfx to Drt LDL if Trig >400; Future  - CBC and differential; Future  - Comprehensive metabolic panel; Future  - TSH; Future    2. Encounter to establish care      3. Left shoulder pain  1 month hx of intermittent shoulder pain with lifting  Recommend rest from lifting activities, routine NSAID dosing  Call if  sxs worsen or don't improve.    4. Other chest pain  Intermittent L chest pressure usually associated with increased stress.   Evaluated at ED x 2 with EKG, CXR, and blood work; all WNL  Currently without pain; will continue to monitor and call if symptoms worsen.    Weight management: Body mass index is 38.21 kg/m..    -     Follow-up: prn      Signed: Hans EdenBETH Marleta Lapierre, NP on 08/04/2018 at 5:15 PM

## 2018-08-09 ENCOUNTER — Other Ambulatory Visit
Admission: RE | Admit: 2018-08-09 | Discharge: 2018-08-09 | Disposition: A | Discharge status: Home / Self Care | Payer: No Typology Code available for payment source | Source: Ambulatory Visit | Attending: Surgery | Admitting: Surgery

## 2018-08-09 DIAGNOSIS — Z0000 Encounter for general adult medical examination without abnormal findings: Secondary | ICD-10-CM

## 2018-08-09 LAB — CBC AND DIFFERENTIAL
Baso # K/uL: 0.1 10*3/uL (ref 0.0–0.1)
Basophil %: 1 %
Eos # K/uL: 0.6 10*3/uL — ABNORMAL HIGH (ref 0.0–0.5)
Eosinophil %: 6.3 %
Hematocrit: 51 % (ref 40–51)
Hemoglobin: 16.4 g/dL (ref 13.7–17.5)
IMM Granulocytes #: 0 10*3/uL
IMM Granulocytes: 0.5 %
Lymph # K/uL: 2.4 10*3/uL (ref 1.3–3.6)
Lymphocyte %: 27.8 %
MCH: 29 pg/cell (ref 26–32)
MCHC: 33 g/dL (ref 32–37)
MCV: 89 fL (ref 79–92)
Mono # K/uL: 0.8 10*3/uL (ref 0.3–0.8)
Monocyte %: 9.7 %
Neut # K/uL: 4.7 10*3/uL (ref 1.8–5.4)
Nucl RBC # K/uL: 0 10*3/uL (ref 0.0–0.0)
Nucl RBC %: 0 /100 WBC (ref 0.0–0.2)
Platelets: 369 10*3/uL — ABNORMAL HIGH (ref 150–330)
RBC: 5.7 MIL/uL (ref 4.6–6.1)
RDW: 14.5 % — ABNORMAL HIGH (ref 11.6–14.4)
Seg Neut %: 54.7 %
WBC: 8.7 10*3/uL (ref 4.2–9.1)

## 2018-08-09 LAB — TSH: TSH: 0.67 u[IU]/mL (ref 0.27–4.20)

## 2018-08-09 LAB — COMPREHENSIVE METABOLIC PANEL
ALT: 29 U/L (ref 0–50)
AST: 22 U/L (ref 0–50)
Albumin: 4.5 g/dL (ref 3.5–5.2)
Alk Phos: 65 U/L (ref 40–130)
Anion Gap: 10 (ref 7–16)
Bilirubin,Total: 0.4 mg/dL (ref 0.0–1.2)
CO2: 28 mmol/L (ref 20–28)
Calcium: 9.8 mg/dL (ref 9.0–10.3)
Chloride: 101 mmol/L (ref 96–108)
Creatinine: 0.87 mg/dL (ref 0.67–1.17)
GFR,Black: 130 *
GFR,Caucasian: 113 *
Glucose: 105 mg/dL — ABNORMAL HIGH (ref 60–99)
Lab: 11 mg/dL (ref 6–20)
Potassium: 4.5 mmol/L (ref 3.3–5.1)
Sodium: 139 mmol/L (ref 133–145)
Total Protein: 7.4 g/dL (ref 6.3–7.7)

## 2018-08-09 LAB — LIPID PANEL
Chol/HDL Ratio: 4.8
Cholesterol: 203 mg/dL — AB
HDL: 42 mg/dL
LDL Calculated: 144 mg/dL — AB
Non HDL Cholesterol: 161 mg/dL
Triglycerides: 85 mg/dL

## 2018-08-10 ENCOUNTER — Other Ambulatory Visit: Payer: Self-pay | Admitting: Cardiology

## 2018-08-10 ENCOUNTER — Emergency Department: Payer: No Typology Code available for payment source

## 2018-08-10 ENCOUNTER — Encounter: Payer: Self-pay | Admitting: Emergency Medicine

## 2018-08-10 ENCOUNTER — Emergency Department
Admission: EM | Admit: 2018-08-10 | Discharge: 2018-08-10 | Disposition: A | Discharge status: Home / Self Care | Payer: No Typology Code available for payment source | Source: Ambulatory Visit | Attending: Emergency Medicine | Admitting: Emergency Medicine

## 2018-08-10 ENCOUNTER — Telehealth: Payer: Self-pay

## 2018-08-10 DIAGNOSIS — R079 Chest pain, unspecified: Secondary | ICD-10-CM | POA: Insufficient documentation

## 2018-08-10 DIAGNOSIS — I499 Cardiac arrhythmia, unspecified: Secondary | ICD-10-CM

## 2018-08-10 DIAGNOSIS — F17210 Nicotine dependence, cigarettes, uncomplicated: Secondary | ICD-10-CM | POA: Insufficient documentation

## 2018-08-10 DIAGNOSIS — R42 Dizziness and giddiness: Secondary | ICD-10-CM

## 2018-08-10 DIAGNOSIS — F419 Anxiety disorder, unspecified: Secondary | ICD-10-CM

## 2018-08-10 DIAGNOSIS — R0789 Other chest pain: Secondary | ICD-10-CM

## 2018-08-10 DIAGNOSIS — M25512 Pain in left shoulder: Secondary | ICD-10-CM | POA: Insufficient documentation

## 2018-08-10 DIAGNOSIS — R110 Nausea: Secondary | ICD-10-CM

## 2018-08-10 LAB — TROPONIN T 3 HR W/ DELTA HIGH SENSITIVITY (IP/ED ONLY): TROP T 3 HR High Sensitivity: 7 ng/L (ref 0–21)

## 2018-08-10 LAB — TROPONIN T 0 HR HIGH SENSITIVITY (IP/ED ONLY): TROP T 0 HR High Sensitivity: <6 ng/L (ref 0–21)

## 2018-08-10 LAB — HOLD BLUE

## 2018-08-10 LAB — HOLD SST

## 2018-08-10 LAB — HOLD LAVENDER

## 2018-08-10 NOTE — ED Triage Notes (Signed)
Notes left sided chest pain with radiation to left shoulder on and off for several weeks.       Triage Note   Donna Bernardaniel K Mccayla Shimada, RN

## 2018-08-10 NOTE — Discharge Instructions (Signed)
You were seen and evaluated in the emergency department today for chest pain. You had troponins drawn and a chest x-ray which showed no evidence of acute heart or lung disease.    Please continue to take all home medications as previously prescribed.    Please call your primary care doctor and notify them that you were seen in the emergency department. Please follow-up with them in 3-5 days for re-evaluation.    A Referral has been sent on your behalf to follow-up with our cardiology providers.  Someone from the service will contact you in the next 2-3 days to schedule follow-up outside of the hospital    Return to the emergency department if you experience worsening chest pain, fever, chills, shortness of breath, nausea, vomiting, dizziness, headache, lightheadedness, abdominal pain, pain that radiates into your back, or for any additional concerns that you may have.

## 2018-08-10 NOTE — Telephone Encounter (Signed)
Patient requesting to speak to the nurse regarding his labs results.

## 2018-08-10 NOTE — ED Provider Notes (Signed)
History     Chief Complaint   Patient presents with    Chest Pain     James Price is a 32 y.o. male with no significant past medical history who presents to the emergency department with left upper chest/shoulder pain.  The patient states that for the last several months he has been experiencing similar pain.  He states that he has been seen multiple times in the emergency department, but states that he has not been able to identify the cause of his pain.     The patient states that he went to his primary care doctor yesterday, and had lab work done today.  He states that he was found to have elevated cholesterol.  The patient states that this morning after going to the lab to have his blood work drawn, around 11:00 he started with left upper chest discomfort that radiated into his shoulder.  The patient states that his mother passed of a massive heart attack so anytime he has chest pain he becomes extremely concerned.  The patient notes that he took aspirin, and ate lunch, and his pain resolved by 1:00 PM.  He states that he then discussed the matter with his mother-in-law who encouraged him to come to the emergency department for repeat evaluation.    At time of evaluation, the patient states that he has a mild discomfort in his left upper chest.  He states that the pain is worse with stretching, but not worse with palpation.  The patient denies any fevers, or chills.  He states that he has intermittent nausea with his chest discomfort as well as the sensation of lightheadedness and shortness of breath.  The patient denies sensation of palpitations.  He denies any urinary symptoms abdominal discomfort, diarrhea, or constipation.          Medical/Surgical/Family History     No past medical history on file.     There is no problem list on file for this patient.           Past Surgical History:   Procedure Laterality Date    ORIF right tibia Right      Family History   Problem Relation Age of Onset    Heart  Disease Mother     High Blood Pressure Mother     Substance abuse Mother     No Known Problems Sister     No Known Problems Brother     Substance abuse Maternal Grandmother           Social History   Substance Use Topics    Smoking status: Current Every Day Smoker     Packs/day: 0.50     Types: Cigarettes    Smokeless tobacco: Never Used    Alcohol use Yes      Comment: 1-2 times month     Living Situation     Questions Responses    Patient lives with Spouse    Homeless No    Caregiver for other family member     External Services     Employment Disabled    Domestic Violence Risk             Review of Systems   Review of Systems   Constitutional: Positive for fatigue. Negative for activity change, appetite change, chills and fever.   HENT: Negative for congestion, rhinorrhea and sore throat.    Eyes: Negative for visual disturbance.   Respiratory: Positive for chest tightness (intermittently, none at present time)  and shortness of breath (intermittently, none at present time). Negative for cough.    Cardiovascular: Positive for chest pain (left upper with radiation into shoulder). Negative for palpitations.   Gastrointestinal: Positive for nausea (with chest pain/tightness, none at present). Negative for abdominal pain, constipation, diarrhea and vomiting.   Genitourinary: Negative for dysuria, frequency and urgency.   Skin: Negative for color change, pallor, rash and wound.   Neurological: Positive for light-headedness. Negative for dizziness and headaches.   Psychiatric/Behavioral: Negative for agitation and confusion. The patient is nervous/anxious.      Physical Exam     Triage Vitals  Triage Start: Start, (08/10/18 1557)   First Recorded BP: 134/82, Resp: 16, Temp: 36.8 C (98.2 F), Temp src: TEMPORAL Oxygen Therapy SpO2: 99 %, O2 Device: None (Room air), Heart Rate: 92, (08/10/18 1604)  .  First Pain Reported  0-10 Scale: 8, Pain Location/Orientation: Chest, (08/10/18 1604)     Physical Exam    Constitutional: He is oriented to person, place, and time. Vital signs are normal. He appears well-developed and well-nourished. He is active and cooperative.  Non-toxic appearance. He does not have a sickly appearance. He does not appear ill. No distress.   HENT:   Head: Normocephalic and atraumatic.   Eyes: EOM are normal.   Neck: Normal range of motion. Neck supple.   Cardiovascular: Normal rate, regular rhythm, normal heart sounds and intact distal pulses.    Pulmonary/Chest: Effort normal and breath sounds normal. No respiratory distress. He has no wheezes. He has no rales. He exhibits no tenderness.   Abdominal: Soft. Bowel sounds are normal. He exhibits no distension. There is no tenderness. There is no rebound and no guarding.   Musculoskeletal: Normal range of motion. He exhibits no tenderness or deformity.   Neurological: He is alert and oriented to person, place, and time. He exhibits normal muscle tone. Coordination normal.   Skin: Skin is warm and dry. Capillary refill takes less than 2 seconds. No rash noted. He is not diaphoretic. No erythema. No pallor.   Psychiatric: He has a normal mood and affect. His behavior is normal. Judgment and thought content normal.   Nursing note and vitals reviewed.    Medical Decision Making   Patient seen by me on:  08/10/2018    Assessment:  James Price is a 32 y.o. male with no significant past medical history who presents to the emergency department with left upper chest/shoulder pain. Patient reports similar symptoms intermittently for the last several months. States that he has been seen in the ED, as well as his PCPs office. Notes that he had lab work this morning that showed elevated cholesterol levels then had an episode of chest pain, so became concerned and came to the ED for evaluation. Physical exam notable for well appearing male in chair. Cardiopulmonary and abdominal exam unremarkable. Patient appears comfortable in no acute distress at time of exam      Differential diagnosis:  Low suspicion for ACS, musculoskeletal pain, anxiety, dysrhythmia, HCL,     Plan:  Orders Placed This Encounter      Chest standard frontal and lateral views      Troponin T 0 HR High Sensitivity      Troponin T 3 HR W/ Delta High Sensitivity      EKG 12 lead (initial)      EKG: initial      EKG: follow up        Independent review of: chart/prior records  Update:   Recent Results (from the past 24 hour(s))   Troponin T 0 HR High Sensitivity    Collection Time: 08/10/18  6:36 PM   Result Value Ref Range    TROP T 0 HR High Sensitivity <6 0 - 21 ng/L   Hold SST    Collection Time: 08/10/18  6:36 PM   Result Value Ref Range    Hold SST HOLD TUBE    Hold lavender    Collection Time: 08/10/18  6:36 PM   Result Value Ref Range    Hold Lav hold    Hold blue    Collection Time: 08/10/18  6:36 PM   Result Value Ref Range    Hold Blue HOLD TUBE    Troponin T 3 HR W/ Delta High Sensitivity    Collection Time: 08/10/18  9:20 PM   Result Value Ref Range    TROP T 3 HR High Sensitivity 7 0 - 21 ng/L    TROP T 0-3 HR DELTA High Sensitivity see below 0 - 11    HS TROP % Change see below 0 - 20 %     Chest Standard Frontal And Lateral Views  Result Date: 08/10/2018  No acute cardiopulmonary disease.     -- Discussed results with patient - patient and family reassured that cardiac labs are normal.     -- Patient provided with education on HCL and low fat diets    -- Patient encouraged to follow-up with PCP in 2-3 days  -- Referral sent for patient to see cardiology on an outpatient basis    -- Patient instructed to return for any worsening chest pain, fever, chills, shortness of breath, nausea, vomiting, dizziness, headache, lightheadedness, abdominal pain, pain that radiates into your back, or for any additional concerns - patient and family express understanding and agreement with current plan of care       Reggy Eye, [place]  08/10/18 11:07 PM         Reggy Eye, PA  08/10/18 2308

## 2018-08-10 NOTE — ED Notes (Signed)
Patient presents to ED with c/o chest pain (pressure, squeezing) off and on x 2 weeks approximately. Also endorsing sob, nausea, dizziness with episodes. Currently experiencing episode of chest pain since this am. Had blood work at PCP this am and says his cholesterol (LDL) was high. Was evaluated at [facility] last week and workup was negative. Family hx of cardiac disease.

## 2018-08-10 NOTE — First Provider Contact (Signed)
ED First Provider Contact Note    Initial provider evaluation performed by   ED First Provider Contact     Date/Time Event User Comments    08/10/18 1557 ED First Provider Contact James Price Initial Face to Face Provider Contact          Vital signs reviewed.    32 yo male presenting with chest pain for the past two months, daily and intermittent, not exertional. Noted he had some pain to the left shoulder last few days. He had blood work yesterday with PCP and was concerned because he had elevated cholesterol.     Orders placed:  EKG and XRAYS     Patient requires further evaluation.     Chanya Chrisley, PA, 08/10/2018, 3:57 PM     Riesa PopeFishel, Rodrigus Kilker, [place]  08/10/18 1558

## 2018-08-10 NOTE — ED Notes (Signed)
Went over patients discharge instructions. Patient verbalized understanding. Appropriate clothing in place. IV removed. Stable and ambulatory. Patient to be driven home by mother in law.

## 2018-08-12 ENCOUNTER — Encounter: Payer: Self-pay | Admitting: Geriatric Medicine

## 2018-08-13 LAB — EKG 12-LEAD
P: 14 deg
PR: 163 ms
QRS: 38 deg
QRSD: 91 ms
QT: 364 ms
QTc: 410 ms
Rate: 76 {beats}/min
T: 44 deg

## 2018-08-15 NOTE — Telephone Encounter (Signed)
Labs reviewed.  Nothing urgent  Has appt on 10/4  Please call patient and reassure and remind him of visit to discuss lab results.    Sonnie Alamo, MD

## 2018-08-16 NOTE — Telephone Encounter (Signed)
Pt states he will be at [facility] on 10/4 to discuss lab results

## 2018-08-19 ENCOUNTER — Encounter: Payer: Self-pay | Admitting: Surgery

## 2018-08-19 ENCOUNTER — Ambulatory Visit: Payer: No Typology Code available for payment source | Attending: Surgery | Admitting: Surgery

## 2018-08-19 VITALS — BP 115/66 | HR 77 | Temp 98.6°F | Ht 70.0 in | Wt 256.0 lb

## 2018-08-19 DIAGNOSIS — R0789 Other chest pain: Secondary | ICD-10-CM

## 2018-08-19 MED ORDER — MELOXICAM 15 MG PO TABS *I*
15.0000 mg | ORAL_TABLET | Freq: Every day | ORAL | 0 refills | Status: DC
Start: 2018-08-19 — End: 2020-07-11

## 2018-08-19 NOTE — Progress Notes (Signed)
[facility]: Follow up visit    CC: James Price is a 32 y.o. male who presents, for Chest Pain (f/up)  HPI    1. 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 He believes the pain starts in his wrist and travels though his shoulder to his chest.  Takes ASA 650mg  twice daily with relief after 30-40 minutes.  Feels a tingling sensation run through his left shoulder down through the rest of his body.  Has felt SOB correlating with the CP.  He does have episodes of panic where his heart races, increased sweating, and increased chest pressure.      Was evaluated at [facility] ED on 08/09/18 for similar CP and cardiac etiology was ruled out.  It has been about 1 year since his mother died from a massive MI.    Denies GERD, frequent panic as contributory.        Allergies   Allergen Reactions    Shellfish-Derived Products Itching       No current outpatient prescriptions on file.         Review of Systems   Constitutional: Negative for chills, fever and malaise/fatigue.   Respiratory: Negative for cough and shortness of breath.    Cardiovascular: Positive for chest pain. Negative for palpitations and leg swelling.   Neurological: Negative for dizziness and headaches.   Psychiatric/Behavioral: Negative for depression, substance abuse and suicidal ideas. The patient is nervous/anxious.      -See HPI for pertinent positives    Objective:  BP 115/66    Pulse 77    Temp 37 C (98.6 F) (Temporal)    Ht 1.778 m (5\' 10" )    Wt 116.1 kg (256 lb)    BMI 36.73 kg/m     Physical Exam   Constitutional: He is oriented to person, place, and time and well-developed, well-nourished, and in no distress.   HENT:   Mouth/Throat: Oropharynx is clear and moist.   Eyes: Pupils are equal, round, and reactive to light.   Neck: Neck supple. No thyromegaly present.   Cardiovascular: Normal rate, regular rhythm  and normal heart sounds.    No murmur heard.  Pulmonary/Chest: Breath sounds normal.   Abdominal: Bowel sounds are normal.   Musculoskeletal:        Right shoulder: He exhibits tenderness. He exhibits no swelling, no deformity, no spasm and normal strength.   Lymphadenopathy:     He has no cervical adenopathy.   Neurological: He is alert and oriented to person, place, and time. He has intact cranial nerves.   Psychiatric: Mood and affect normal.   Grieving appropriately with anniversary of mother's death        Assessment/Plan:     1. Other chest pain  Most likely musculoskeletal stemming from R shoulder  Will trial meloxicam instead of ASA, with food.  He has an upcoming apt with cardiology, which will hopefully reassure pt.    - meloxicam (MOBIC) 15 MG tablet; Take 1 tablet (15 mg total) by mouth daily   Take with food.  Dispense: 30 tablet; Refill: 0      Return for prn or for yearly check up.        Patient's allergies medications, medical, surgical, social, and  family were reviewed and/or updated as appropriate.    Patient verbalized understanding of information presented,and confirmed agreement with current  plan of care. Reviewed risk, benefits prescribed/refilled medications.

## 2018-08-19 NOTE — Patient Instructions (Signed)
Please trial meloxicam for musculoskeletal pain daily, taken with food.

## 2018-08-22 ENCOUNTER — Encounter: Payer: Self-pay | Admitting: Cardiology

## 2018-08-22 ENCOUNTER — Ambulatory Visit: Payer: No Typology Code available for payment source | Attending: Cardiology | Admitting: Cardiology

## 2018-08-22 VITALS — BP 118/68 | HR 78 | Ht 69.29 in | Wt 258.0 lb

## 2018-08-22 DIAGNOSIS — R079 Chest pain, unspecified: Secondary | ICD-10-CM

## 2018-08-22 NOTE — Progress Notes (Signed)
Cardiology New Patient Consultation    Name: [name] PCP: Dimitri Ped, MD   DOB: [birth year] Date of Visit: 08/22/2018     History of Present Illness   Reason for Visit: Chest pain    Mr Ayuub Penley is a 32 y.o. man with no prior cardiac history who presents for evaluation of chest pain.    The patient relates that his symptoms started about 6 weeks ago. He has been to the ED 3 times for evaluation. He describes a pressure sensation on the left side of his chest that sometimes radiates to his left arm and left shoulder. There is associated shortness of breath. Symptoms occur almost every day. 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 Pain is not made worse with positional changes, deep breaths, coughing, or manual palpation.    Family history notable for his mother passed away from a myocardial infarction at age 12  His siblings do not have heart disease  Smokes one pack per day.      Past Medical, Surgical, Family and Social History   No past medical history on file.  Past Surgical History:   Procedure Laterality Date    ORIF right tibia Right      Family History   Problem Relation Age of Onset    Heart Disease Mother     High Blood Pressure Mother     Substance abuse Mother     No Known Problems Sister     No Known Problems Brother     Substance abuse Maternal Grandmother      Social History     Social History    Marital status: Single     Spouse name: N/A    Number of children: 3    Years of education: N/A     Occupational History    warehouse work      [employer]     Social History Main Topics    Smoking status: Current Every Day Smoker     Packs/day: 0.50     Types: Cigarettes    Smokeless tobacco: Never Used    Alcohol use Yes      Comment: 1-2 times month    Drug use: No      Comment: previous use x 3 weeks    Sexual activity: Yes     Partners: Female     Other Topics Concern    None     Social History Narrative     Lives with Dad       Review of Systems   A 10+ system review was conducted and was negative except as above    Medications and Allergies     Patient's Medications   New Prescriptions    No medications on file   Previous Medications    ASPIRIN 325 MG TABLET    Take 325 mg by mouth daily    MELOXICAM (MOBIC) 15 MG TABLET    Take 1 tablet (15 mg total) by mouth daily   Take with food.   Modified Medications    No medications on file   Discontinued Medications    No medications on file     Allergies   Allergen Reactions    Shellfish-Derived Products Itching       Vitals and Physical Exam     VS:  BP 118/68    Pulse 78    Ht 1.76 m (5' 9.29")  Wt 117 kg (258 lb)    SpO2 97%    BMI 37.78 kg/m    Gen:  Appears comfortable  HENT:  NC/AT, neck supple, JVP not distended  Resp:  CTA  CV:  RRR, S1S2. No murmurs, rubs, or gallops. No lower extremity edema  GI:  Abdomen is soft, non-tender, non-distended  Msk:  No cyanosis, no clubbing, no joint tenderness or erythema  Skin:  No apparent rashes    Laboratory Data     Lab Results   Component Value Date    CREAT 0.87 08/09/2018    UN 11 08/09/2018    NA 139 08/09/2018    K 4.5 08/09/2018    CL 101 08/09/2018    CO2 28 08/09/2018         Lab results: 08/09/18  0957   Cholesterol 203*   HDL 42   LDL Calculated 144*   Triglycerides 85   Non HDL Cholesterol 161   Chol/HDL Ratio 4.8     No results found for: HA1C    Cardiac/Imaging Data     ECG (08/10/18- ED): NSR, borderline T wave abnormalities                      No orders of the defined types were placed in this encounter.      Impression & Plan     32 y.o. man with no prior cardiac history who presents for evaluation of chest pain.    The patient describes episodic chest discomfort for several weeks, sometimes occurring with physical exertion and sometimes at rest. Given his cardiovascular risk factors we will proceed with stress testing to rule out obstructive coronary disease. I have ordered an exercise stress echocardiogram  to be done in the near future. I will plan on following up with Mr Bohr after his stress test.          [name], MD  Cardiovascular Disease  [facility]

## 2018-08-29 ENCOUNTER — Ambulatory Visit
Admission: RE | Admit: 2018-08-29 | Discharge: 2018-08-29 | Disposition: A | Discharge status: Home / Self Care | Payer: No Typology Code available for payment source | Source: Ambulatory Visit | Attending: Cardiology | Admitting: Cardiology

## 2018-08-29 ENCOUNTER — Telehealth: Payer: Self-pay | Admitting: Cardiology

## 2018-08-29 DIAGNOSIS — R079 Chest pain, unspecified: Secondary | ICD-10-CM | POA: Insufficient documentation

## 2018-08-29 LAB — EXERCISE STRESS ECHO COMPLETE
AV Area (LVOT SR Mtd): 2.88 cm2
AV Area (LVOT SV Mtd): 2.92 cm2
AV CWD VTI: 25.7 cm
AV CWD Velocity (Mean): 105 cm/s
AV CWD Velocity (Peak): 152 cm/s
AV Gradient (mean): 2.1 mmHg
AV Gradient (peak): 4.4 mmHg
Aortic Arch Diameter: 2.8 cm
Aortic Diameter (mid tubular): 3.1 cm
Aortic Diameter (sinus of Valsalva): 3 cm
BMI: 37.9 kg/m2
BP Diastolic: 72 mmHg
BP Systolic: 140 mmHg
BSA: 2.39 m2
E/A ratio: 0.93
Estimated workload: 10.1 METS
Heart Rate: 94 {beats}/min
Height: 69.291 in
LA Systolic Diameter: 4.37 cm
LA Systolic Vol BSA Index: 24.2 mL/m2
LA Systolic Vol Height Index: 32.8 mL/m
LA Systolic Volume: 57.8 mL
LV ASE Mass BSA Index: 75.8 gm/m2
LV ASE Mass Height 2.7 Index: 39.4 gm/m2.7
LV ASE Mass Height Index: 103 gm/m
LV ASE Mass: 181.2 gm
LV Posterior Wall Thickness: 1.1 cm
LV Septal Thickness: 1.1 cm
LVED Diameter BSA Index: 1.9 cm/m2
LVED Diameter Height Index: 2.6 cm/m
LVED Diameter: 4.6 cm
LVES Diameter BSA Index: 1.1 cm/m2
LVES Diameter Height Index: 1.5 cm/m
LVES Diameter: 2.6 cm
LVOT + AV Gradient (mean): 4.4 mmHg
LVOT + AV Gradient (peak): 9.2 mmHg
LVOT Area (calculated): 3.97 cm2
LVOT Cardiac Index: 2.95 L/min/m2
LVOT Cardiac Output: 7.06 L/min
LVOT Diameter: 2.25 cm
LVOT PWD VTI: 18.9 cm
LVOT PWD Velocity (mean): 75.8 cm/s
LVOT PWD Velocity (peak): 110 cm/s
LVOT SV BSA Index: 31.43 mL/m2
LVOT SV Height Index: 42.7 mL/m
LVOT Stroke Rate (mean): 301.2 mL/s
LVOT Stroke Rate (peak): 437.1 mL/s
LVOT Stroke Volume: 75.11 cc
LVOT/AV Velocity Ratio: 0.72
MPHR: 187 {beats}/min
MV Peak A Velocity: 74.5 cm/s
MV Peak E Velocity: 69.6 cm/s
Mitral Annular E/Ea Vel Ratio: 4.17
Mitral Annular Ea Velocity: 16.7 cm/s
Peak DBP: 84 mmHg
Peak Gradient - TR: 23 mmHg
Peak HR: 184 {beats}/min
Peak SBP: 190 mmHg
Percent MPHR: 98.4 %
RA Pressure Estimate: 3 mmHg
RPP: 34960 BPM x mmHG
RR Interval: 638.3 ms
RV Peak Systolic Pressure: 26 mmHg
Stress Peak Stage: 3
Stress duration (min): 9 min
Stress duration (sec): 0 s
Weight (lbs): 258 [lb_av]
Weight: 4128 oz

## 2018-08-29 MED ORDER — PERFLUTREN LIPID MICROSPHERE 1.1 MG/ML (DEFINITY) IV SUSP *I*
1.3000 mL | INTRAVENOUS | Status: DC | PRN
Start: 2018-08-29 — End: 2018-08-30

## 2018-08-29 MED ORDER — SODIUM CHLORIDE 0.9 % INJ (FLUSH) WRAPPED *I*
10.0000 mL | Status: AC | PRN
Start: 2018-08-29 — End: 2018-08-29
  Administered 2018-08-29 (×2): 10 mL via INTRAVENOUS

## 2018-08-29 NOTE — Telephone Encounter (Signed)
Called patient and reviewed normal stress echo results. We can keep follow up open ended. Annamarie Dawley. Lyndel Pleasure, MD 2:34 PM 08/29/2018

## 2019-11-17 LAB — UNMAPPED LAB RESULTS
Basophil # (HT): 0.1 10 3/uL (ref 0.0–0.2)
Basophil % (HT): 1 % (ref 0–3)
Eosinophil # (HT): 0.5 10 3/uL (ref 0.0–0.6)
Eosinophil % (HT): 5 % (ref 0–5)
Hematocrit (HT): 45 % (ref 40–52)
Hemoglobin (HGB) (HT): 14.7 g/dL (ref 13.0–18.0)
Lymphocyte # (HT): 1.8 10 3/uL (ref 1.0–4.8)
Lymphocyte % (HT): 19 % (ref 15–45)
MCHC (HT): 32.7 g/dL (ref 32.0–37.5)
MCV (HT): 86 fL (ref 80–100)
Mean Corpuscular Hemoglobin (MCH) (HT): 28.3 pg (ref 26.0–34.0)
Monocyte # (HT): 0.7 10 3/uL (ref 0.1–1.0)
Monocyte % (HT): 8 % (ref 0–15)
Neutrophil # (HT): 6 10 3/uL (ref 1.8–8.0)
Platelets (HT): 336 10 3/uL (ref 150–450)
RBC (HT): 5.2 10 6/uL (ref 4.40–6.20)
RDW (HT): 14.5 % (ref 0.0–15.2)
Seg Neut % (HT): 66 % (ref 45–75)
WBC (HT): 9.1 10 3/uL (ref 4.0–11.0)

## 2020-02-07 ENCOUNTER — Encounter (HOSPITAL_BASED_OUTPATIENT_CLINIC_OR_DEPARTMENT_OTHER): Payer: Self-pay

## 2020-02-07 ENCOUNTER — Emergency Department (HOSPITAL_BASED_OUTPATIENT_CLINIC_OR_DEPARTMENT_OTHER)
Admission: EM | Admit: 2020-02-07 | Discharge: 2020-02-07 | Disposition: A | Discharge status: Home / Self Care | Payer: Medicaid - Out of State | Attending: Emergency Medicine | Admitting: Emergency Medicine

## 2020-02-07 ENCOUNTER — Emergency Department (HOSPITAL_BASED_OUTPATIENT_CLINIC_OR_DEPARTMENT_OTHER): Payer: Medicaid - Out of State

## 2020-02-07 ENCOUNTER — Other Ambulatory Visit: Payer: Self-pay

## 2020-02-07 DIAGNOSIS — R0789 Other chest pain: Secondary | ICD-10-CM

## 2020-02-07 DIAGNOSIS — F17210 Nicotine dependence, cigarettes, uncomplicated: Secondary | ICD-10-CM | POA: Diagnosis not present

## 2020-02-07 DIAGNOSIS — R05 Cough: Secondary | ICD-10-CM

## 2020-02-07 DIAGNOSIS — Z20822 Contact with and (suspected) exposure to covid-19: Secondary | ICD-10-CM | POA: Insufficient documentation

## 2020-02-07 DIAGNOSIS — Z720 Tobacco use: Secondary | ICD-10-CM

## 2020-02-07 DIAGNOSIS — R079 Chest pain, unspecified: Secondary | ICD-10-CM | POA: Diagnosis present

## 2020-02-07 DIAGNOSIS — R059 Cough, unspecified: Secondary | ICD-10-CM

## 2020-02-07 LAB — CBC WITH DIFFERENTIAL/PLATELET
Abs Immature Granulocytes: 0.02 10*3/uL (ref 0.00–0.07)
Basophils Absolute: 0.1 10*3/uL (ref 0.0–0.1)
Basophils Relative: 1 %
Eosinophils Absolute: 0.9 10*3/uL — ABNORMAL HIGH (ref 0.0–0.5)
Eosinophils Relative: 9 %
HCT: 46 % (ref 39.0–52.0)
Hemoglobin: 15.4 g/dL (ref 13.0–17.0)
Immature Granulocytes: 0 %
Lymphocytes Relative: 29 %
Lymphs Abs: 2.8 10*3/uL (ref 0.7–4.0)
MCH: 29.3 pg (ref 26.0–34.0)
MCHC: 33.5 g/dL (ref 30.0–36.0)
MCV: 87.5 fL (ref 80.0–100.0)
Monocytes Absolute: 0.7 10*3/uL (ref 0.1–1.0)
Monocytes Relative: 8 %
Neutro Abs: 5.2 10*3/uL (ref 1.7–7.7)
Neutrophils Relative %: 53 %
Platelets: 334 10*3/uL (ref 150–400)
RBC: 5.26 MIL/uL (ref 4.22–5.81)
RDW: 14.1 % (ref 11.5–15.5)
WBC: 9.8 10*3/uL (ref 4.0–10.5)
nRBC: 0 % (ref 0.0–0.2)

## 2020-02-07 LAB — COMPREHENSIVE METABOLIC PANEL
ALT: 38 U/L (ref 0–44)
AST: 25 U/L (ref 15–41)
Albumin: 3.8 g/dL (ref 3.5–5.0)
Alkaline Phosphatase: 55 U/L (ref 38–126)
Anion gap: 9 (ref 5–15)
BUN: 10 mg/dL (ref 6–20)
CO2: 24 mmol/L (ref 22–32)
Calcium: 9.1 mg/dL (ref 8.9–10.3)
Chloride: 102 mmol/L (ref 98–111)
Creatinine, Ser: 0.85 mg/dL (ref 0.61–1.24)
GFR calc Af Amer: >60 mL/min (ref >60)
GFR calc non Af Amer: >60 mL/min (ref >60)
Glucose, Bld: 158 mg/dL — ABNORMAL HIGH (ref 70–99)
Potassium: 3.8 mmol/L (ref 3.5–5.1)
Sodium: 135 mmol/L (ref 135–145)
Total Bilirubin: 0.7 mg/dL (ref 0.3–1.2)
Total Protein: 7 g/dL (ref 6.5–8.1)

## 2020-02-07 LAB — TROPONIN I (HIGH SENSITIVITY)
Troponin I (High Sensitivity): <2 ng/L (ref <18)
Troponin I (High Sensitivity): 2 ng/L (ref <18)

## 2020-02-07 LAB — SARS CORONAVIRUS 2 AG (30 MIN TAT): SARS Coronavirus 2 Ag: NEGATIVE

## 2020-02-07 LAB — LIPASE, BLOOD: Lipase: 28 U/L (ref 11–51)

## 2020-02-07 MED ORDER — ALBUTEROL SULFATE HFA 108 (90 BASE) MCG/ACT IN AERS
2.0000 | INHALATION_SPRAY | Freq: Once | RESPIRATORY_TRACT | Status: AC
  Administered 2020-02-07: 2 via RESPIRATORY_TRACT
  Filled 2020-02-07: qty 6.7

## 2020-02-07 MED ORDER — AEROCHAMBER PLUS FLO-VU MEDIUM MISC
1.0000 | Freq: Once | Status: AC
  Administered 2020-02-07: 1
  Filled 2020-02-07: qty 1

## 2020-02-07 NOTE — ED Triage Notes (Signed)
Pt c/o CP x 1 week-prod cough x 2 days-NAD-steady gait

## 2020-02-07 NOTE — Discharge Instructions (Signed)
You can use your inhaler by taking 2 puffs every 4 hours as needed.  If you feel like you need to use it again you may use it 1 more time during the 4-hour period.  If you still feel short of breath you need to seek additional medical care and evaluation.  Please consider taking a daily allergy medication to help with your symptoms.  I suggest a less drowsy 24 hour medication such as allegra, zyrtec or Claritin or the generic version.    Please also use a nasal steroid spray such as Nasacort or Flonase.  As we discussed today I can hear the changes of smoking when I listen to your lungs. It is important that you stop smoking.

## 2020-02-07 NOTE — ED Provider Notes (Signed)
[facility] EMERGENCY DEPARTMENT Provider Note   CSN: [id number] Arrival date & time: 02/07/20  1349     History Chief Complaint  Patient presents with  . Chest Pain    Travis Porter is a 34 y.o. male with a past medical history of tobacco abuse, who presents today for evaluation of chest pain. He reports that he intermittently over the past few years we will get left-sided chest pressure.  He reports that over the past 2 days he has also been coughing and short of breath which he states is new for him.  He denies any fevers.  No nausea vomiting diarrhea or headache.  No known coronavirus contacts. He states that the chest pressure occasionally goes into his left arm.  He denies any recent trauma.  He reports his cough is occasionally productive with mucus.  He denies any leg swelling.  He states that he last had pain approximately 30 minutes prior to arrival and it lasted for about 25 minutes before resolving without intervention.  He reports when this happened he felt short of breath, denies diaphoresis, vomiting.  Chart review shows he previously eloped from [facility] 2 days ago for similar complaint.  HPI     History reviewed. No pertinent past medical history.  There are no problems to display for this patient.   History reviewed. No pertinent surgical history.     No family history on file.  Social History   Tobacco Use  . Smoking status: Current Every Day Smoker    Types: Cigarettes  . Smokeless tobacco: Never Used  Substance Use Topics  . Alcohol use: Yes    Comment: occ  . Drug use: Never    Home Medications Prior to Admission medications   Not on File    Allergies    Shellfish allergy  Review of Systems   Review of Systems  Constitutional: Negative for chills and fever.  HENT: Negative for congestion.   Respiratory: Positive for cough, chest tightness and shortness of breath. Negative for wheezing.   Cardiovascular:  Positive for chest pain. Negative for palpitations and leg swelling.  Gastrointestinal: Negative for abdominal pain, diarrhea, nausea and vomiting.  Genitourinary: Negative for enuresis and urgency.  Musculoskeletal: Negative for back pain and neck pain.  Skin: Negative for color change and wound.  Neurological: Negative for weakness and headaches.  Psychiatric/Behavioral: Negative for confusion.  All other systems reviewed and are negative.   Physical Exam Updated Vital Signs BP (!) 99/52 (BP Location: Left Arm)   Pulse 75   Temp 98.3 F (36.8 C) (Oral)   Resp 20   SpO2 98%   Physical Exam Vitals and nursing note reviewed.  Constitutional:      Appearance: He is well-developed.  HENT:     Head: Normocephalic and atraumatic.  Eyes:     Conjunctiva/sclera: Conjunctivae normal.  Cardiovascular:     Rate and Rhythm: Normal rate and regular rhythm.     Pulses:          Radial pulses are 2+ on the left side.       Dorsalis pedis pulses are 2+ on the right side and 2+ on the left side.       Posterior tibial pulses are 2+ on the right side and 2+ on the left side.     Heart sounds: Normal heart sounds. No murmur.  Pulmonary:     Effort: Pulmonary effort is normal. No respiratory distress.     Breath  sounds: Examination of the right-middle field reveals rhonchi. Examination of the left-middle field reveals rhonchi. Examination of the right-lower field reveals rhonchi. Examination of the left-lower field reveals rhonchi. Rhonchi present.  Chest:     Chest wall: No mass or tenderness.  Abdominal:     Palpations: Abdomen is soft. There is no mass.     Tenderness: There is no abdominal tenderness.  Musculoskeletal:     Cervical back: Neck supple.     Right lower leg: No tenderness. No edema.     Left lower leg: No tenderness. No edema.  Skin:    General: Skin is warm and dry.  Neurological:     General: No focal deficit present.     Mental Status: He is alert.  Psychiatric:         Mood and Affect: Mood normal.        Behavior: Behavior normal.     ED Results / Procedures / Treatments   Labs (all labs ordered are listed, but only abnormal results are displayed) Labs Reviewed  COMPREHENSIVE METABOLIC PANEL - Abnormal; Notable for the following components:      Result Value   Glucose, Bld 158 (*)    All other components within normal limits  CBC WITH DIFFERENTIAL/PLATELET - Abnormal; Notable for the following components:   Eosinophils Absolute 0.9 (*)    All other components within normal limits  SARS CORONAVIRUS 2 AG (30 MIN TAT)  SARS CORONAVIRUS 2 (TAT 6-24 HRS)  LIPASE, BLOOD  CBC WITH DIFFERENTIAL/PLATELET  TROPONIN I (HIGH SENSITIVITY)  TROPONIN I (HIGH SENSITIVITY)    EKG EKG Interpretation  Date/Time:  Wednesday February 07 2020 13:57:47 EDT Ventricular Rate:  84 PR Interval:    QRS Duration: 89 QT Interval:  372 QTC Calculation: 440 R Axis:   45 Text Interpretation: Sinus rhythm Borderline T abnormalities, lateral leads Baseline wander in lead(s) V3 V4 V6 Confirmed by Madalyn Rob [phone]) on 02/07/2020 2:00:19 PM   Radiology DG Chest Port 1 View  Result Date: 02/07/2020 CLINICAL DATA:  Chest pain, lightheaded, dizziness X 1 week, dry cough X 2 days. Pain is intermittently stabbing or pressure in the middle of his chest radiating under the left arm and down the left arm. Denies fever and chills. No known heart or lung conditions. Pt is smoker.COVID test pending. EXAM: PORTABLE CHEST 1 VIEW COMPARISON:  None. FINDINGS: Normal heart, mediastinum and hila. Clear lungs.  No pleural effusion or pneumothorax. Skeletal structures are grossly intact. IMPRESSION: No active disease. Electronically Signed   By: Lajean Manes M.D.   On: 02/07/2020 14:39    Procedures Procedures (including critical care time)  Medications Ordered in ED Medications  albuterol (VENTOLIN HFA) 108 (90 Base) MCG/ACT inhaler 2 puff (2 puffs Inhalation Given 02/07/20 1430)   AeroChamber Plus Flo-Vu Medium MISC 1 each (1 each Other Given 02/07/20 1430)    ED Course  I have reviewed the triage vital signs and the nursing notes.  Pertinent labs & imaging results that were available during my care of the patient were reviewed by me and considered in my medical decision making (see chart for details).    MDM Rules/Calculators/A&P                     Patient is a 34 year old man who presents today for evaluation of cough and shortness of breath worsening over the past 2 days with intermittent chest pain.  Troponin x2 is negative, EKG without evidence of ischemia.  Chest x-ray clear without pneumothorax consolidation or other abnormalities. Patient does smoke and use tobacco, we discussed the importance of tobacco cessation. He was treated with albuterol inhaler while in the emergency room and felt better.  He does not have a chronic cough, does not meet criteria for acute exacerbation of COPD.  I suspect he also has a component of seasonal allergies. Recommended starting Allegra, Claritin, or Zyrtec in addition to nasal corticosteroid spray.  Covid testing will be sent due to shortness of breath and cough.  PERC negative.  He does not have significant family history of MI or cardiac event before the age of 66.  I do not suspect cardiac cause for his pain at this time.  Recommended outpatient follow-up.    Heartscore is low risk.    Return precautions were discussed with patient who states their understanding.  At the time of discharge patient denied any unaddressed complaints or concerns.  Patient is agreeable for discharge home.  Note: Portions of this report may have been transcribed using voice recognition software. Every effort was made to ensure accuracy; however, inadvertent computerized transcription errors may be present  Final Clinical Impression(s) / ED Diagnoses Final diagnoses:  Atypical chest pain  Cough  Tobacco abuse    Rx / DC Orders ED  Discharge Orders    None       Cristina Gong, PA-C 02/07/20 1733    Virgina Norfolk, DO 02/07/20 1750

## 2020-02-08 LAB — SARS CORONAVIRUS 2 (TAT 6-24 HRS): SARS Coronavirus 2: NEGATIVE

## 2020-07-04 ENCOUNTER — Emergency Department
Admission: EM | Admit: 2020-07-04 | Discharge: 2020-07-04 | Discharge status: Against Medical Advice | Payer: MEDICAID | Source: Ambulatory Visit | Attending: Emergency Medicine | Admitting: Emergency Medicine

## 2020-07-04 ENCOUNTER — Emergency Department: Payer: MEDICAID | Admitting: Radiology

## 2020-07-04 DIAGNOSIS — R079 Chest pain, unspecified: Secondary | ICD-10-CM

## 2020-07-04 DIAGNOSIS — Z5321 Procedure and treatment not carried out due to patient leaving prior to being seen by health care provider: Secondary | ICD-10-CM | POA: Insufficient documentation

## 2020-07-04 NOTE — ED Triage Notes (Signed)
Pt c/o left sided chest pain that radiates under left breast since 1030am. Pain started at rest, is intermittent. Took 2 Asprin. This morning he woke up with left arm/pinky numbness then then pain started after. Full strength at triage.        Triage Note   James Price

## 2020-07-09 ENCOUNTER — Encounter: Payer: Self-pay | Admitting: Family Medicine

## 2020-07-11 ENCOUNTER — Encounter: Payer: Self-pay | Admitting: Family Medicine

## 2020-07-11 ENCOUNTER — Other Ambulatory Visit: Payer: Self-pay

## 2020-07-11 ENCOUNTER — Ambulatory Visit: Payer: MEDICAID | Attending: Family Medicine | Admitting: Family Medicine

## 2020-07-11 VITALS — BP 133/83 | HR 98 | Temp 97.0°F | Ht 70.0 in | Wt 298.0 lb

## 2020-07-11 DIAGNOSIS — E785 Hyperlipidemia, unspecified: Secondary | ICD-10-CM | POA: Insufficient documentation

## 2020-07-11 DIAGNOSIS — K219 Gastro-esophageal reflux disease without esophagitis: Secondary | ICD-10-CM | POA: Insufficient documentation

## 2020-07-11 DIAGNOSIS — Z91013 Allergy to seafood: Secondary | ICD-10-CM | POA: Insufficient documentation

## 2020-07-11 DIAGNOSIS — Z0000 Encounter for general adult medical examination without abnormal findings: Secondary | ICD-10-CM | POA: Insufficient documentation

## 2020-07-11 DIAGNOSIS — M25532 Pain in left wrist: Secondary | ICD-10-CM

## 2020-07-11 DIAGNOSIS — E669 Obesity, unspecified: Secondary | ICD-10-CM | POA: Insufficient documentation

## 2020-07-11 MED ORDER — OMEPRAZOLE 20 MG PO CPDR *I*
20.0000 mg | DELAYED_RELEASE_CAPSULE | Freq: Every day | ORAL | 5 refills | Status: DC
Start: 2020-07-11 — End: 2020-11-08

## 2020-07-11 MED ORDER — IBUPROFEN 600 MG PO TABS *I*
600.0000 mg | ORAL_TABLET | Freq: Three times a day (TID) | ORAL | 0 refills | Status: DC | PRN
Start: 2020-07-11 — End: 2020-11-28

## 2020-07-11 NOTE — Progress Notes (Signed)
Subjective    History:    James Price is a 34 y.o. male who presents for Referral - New (allergist ), Pleurisy, and Hand Pain (6 days numbes from pinky finger to wrist )      HPI     Hand numbness  Left ulnar numbness X 4 days. Went to ED, left before being evaluated. No injury, thinks he slept weird. Does have some wrist pain, no swelling. No fever or chills.     GERD  Intermittent chest pain, sharp. Happens about every other day. Occurs at rest. Symptoms last a couple seconds. Also has intermittent left chest heaviness. Ongoing since 2019, evaluated by cardiology at that time. Mom died of MI in 31's.   Lipids checked at that time, never followed up on results. Cholesterol 203, LDL 144.  Symptoms usually improve with activity.  Does report some chest burning, no bad taste in mouth.  Takes aspirin as well, which is helpful.    Exercise stress from 08/2018  1. Normal LV size, mass, and EF without regional wall motion abnormalities  2. No significant valvular abnormalities  3. Normal right heart size and function with estimated normal pulmonary artery systolic pressure   4. Positive saline contrast study for right to left shunting  5. No ischemic ECG changes or arrhythmias with exercise   6. Negative exercise stress echo for myocardial ischemia at the diagnostic workload achieved    Obesity  - No exercise.   - Eating late at night. Usually eats take out.   - Smoking: 1 pack a day X 10 years. Not interested in smoking cessation aid.     Allergy testing  Would like referral to see if he has an allergy to shellfish.          Objective   Examination:  Vitals:    07/11/20 0955   BP: 133/83   Pulse: 98   Temp: 36.1 C (97 F)   TempSrc: Temporal   Weight: 135.2 kg (298 lb)   Height: 1.778 m (5\' 10" )     Body mass index is 42.76 kg/m.    Wt Readings from Last 3 Encounters:   07/11/20 135.2 kg (298 lb)   08/29/18 117 kg (258 lb)   08/22/18 117 kg (258 lb)     BP Readings from Last 3 Encounters:   07/11/20 133/83    08/29/18 140/72   08/22/18 118/68     Physical Exam  Constitutional:       General: He is not in acute distress.  HENT:      Head: Normocephalic.   Cardiovascular:      Rate and Rhythm: Normal rate and regular rhythm.      Heart sounds: No murmur heard.   No friction rub. No gallop.    Pulmonary:      Effort: No respiratory distress.      Breath sounds: Normal breath sounds.   Abdominal:      General: Bowel sounds are normal.      Palpations: Abdomen is soft.   Musculoskeletal:      Left wrist: No swelling, deformity or tenderness. Normal range of motion. Normal pulse.      Comments: Strength equal to bilateral hands   Skin:     General: Skin is warm and dry.   Neurological:      Mental Status: He is alert and oriented to person, place, and time.   Psychiatric:         Mood and Affect:  Mood and affect normal.         Cognition and Memory: Memory normal.         Judgment: Judgment normal.       EKG: normal EKG, normal sinus rhythm. Reviewed by Dr. Manson Passey        Assessment         Assessment/Plan:    1. Left wrist pain  New problem.  Exam unremarkable.  Likely related to wrist tendinitis.  Will trial ibuprofen.  Follow-up in 1 month for reevaluation.    - ibuprofen (ADVIL,MOTRIN) 600 MG tablet; Take 1 tablet (600 mg total) by mouth 3 times daily as needed for Pain  Dispense: 30 tablet; Refill: 0    2. GERD (gastroesophageal reflux disease)  New problem  Ongoing atypical chest pain for the past 2 years.  Prior cardiac evaluation negative.  EKG unremarkable today.  Potentially related to GERD.  Will trial PPI as listed below.  Follow-up in 1 month for reevaluation.    - EKG 12 lead  - omeprazole (PRILOSEC) 20 MG capsule; Take 1 capsule (20 mg total) by mouth daily (before breakfast)  Dispense: 30 capsule; Refill: 5    3. Hyperlipidemia, unspecified  Established problem.  Never followed up on prior abnormal lipid panel.  Will repeat blood work today.  If cholesterol remains elevated, will start statin.    -  Comprehensive metabolic panel; Future  - Hemoglobin A1c; Future  - Lipid Panel (Reflex to Direct  LDL if Triglycerides more than 400); Future    4. Obesity, unspecified  Established problem, not at goal  - Discussion options for healthier eating and increased activity to help with weight loss.  - Advised at least 30 - 45  minutes of moderate exercise per day at least 5 days per week.   - Recommended healthy foods including lots of fruits, vegetables, and whole grains. I  recommended eating  smaller meals 3/day, avoid skipping meals and snacking between meals. I discouraged  drinking  soda,  sweetened drinks, and juice.  I discouraged eating fried and processed foods.  - reinforced the goal of lifestyle changes to help them lose weight gradually overtime will help improve their health and lower their risk from disease    5. Shellfish allergy  - AMB REFERRAL TO ALLERGY/IMMUNOLOGY/RHEUMATOLOGY       Return in about 4 weeks (around 08/08/2020) for Review blood work.               Georgeanne Nim, NP   [facility]  [address] [place], [address]  [place] [address]  Dept: [phone]  Dept Fax: [phone]

## 2020-07-12 ENCOUNTER — Other Ambulatory Visit
Admission: RE | Admit: 2020-07-12 | Discharge: 2020-07-12 | Disposition: A | Discharge status: Home / Self Care | Payer: MEDICAID | Source: Ambulatory Visit | Attending: Family Medicine | Admitting: Family Medicine

## 2020-07-12 DIAGNOSIS — E785 Hyperlipidemia, unspecified: Secondary | ICD-10-CM | POA: Insufficient documentation

## 2020-07-12 LAB — COMPREHENSIVE METABOLIC PANEL
ALT: 30 U/L (ref 0–50)
AST: 21 U/L (ref 0–50)
Albumin: 4.4 g/dL (ref 3.5–5.2)
Alk Phos: 66 U/L (ref 40–130)
Anion Gap: 12 (ref 7–16)
Bilirubin,Total: 0.6 mg/dL (ref 0.0–1.2)
CO2: 26 mmol/L (ref 20–28)
Calcium: 9.5 mg/dL (ref 9.0–10.3)
Chloride: 102 mmol/L (ref 96–108)
Creatinine: 1.03 mg/dL (ref 0.67–1.17)
GFR,Black: 108 *
GFR,Caucasian: 93 *
Glucose: 105 mg/dL — ABNORMAL HIGH (ref 60–99)
Lab: 9 mg/dL (ref 6–20)
Potassium: 4.3 mmol/L (ref 3.3–5.1)
Sodium: 140 mmol/L (ref 133–145)
Total Protein: 6.9 g/dL (ref 6.3–7.7)

## 2020-07-12 LAB — LIPID PANEL
Chol/HDL Ratio: 5.6
Cholesterol: 191 mg/dL
HDL: 34 mg/dL — ABNORMAL LOW (ref 40–60)
LDL Calculated: 132 mg/dL — AB
Non HDL Cholesterol: 157 mg/dL
Triglycerides: 124 mg/dL

## 2020-07-12 LAB — HEMOGLOBIN A1C: Hemoglobin A1C: 6.1 % — ABNORMAL HIGH

## 2020-07-16 ENCOUNTER — Encounter: Payer: Self-pay | Admitting: Family Medicine

## 2020-07-16 DIAGNOSIS — R7303 Prediabetes: Secondary | ICD-10-CM

## 2020-07-16 HISTORY — DX: Prediabetes: R73.03

## 2020-07-20 DIAGNOSIS — R079 Chest pain, unspecified: Secondary | ICD-10-CM | POA: Insufficient documentation

## 2020-07-20 DIAGNOSIS — R0789 Other chest pain: Secondary | ICD-10-CM

## 2020-07-20 DIAGNOSIS — F17210 Nicotine dependence, cigarettes, uncomplicated: Secondary | ICD-10-CM | POA: Insufficient documentation

## 2020-07-20 NOTE — ED Triage Notes (Signed)
C/o sudden onset of chest pain/"bubble" while driving at about 7pm, has not resolved. Reports he was seen here recently for the same with a negative work up.        Triage Note   Luisa Dago, RN

## 2020-07-21 ENCOUNTER — Emergency Department
Admission: EM | Admit: 2020-07-21 | Discharge: 2020-07-21 | Disposition: A | Discharge status: Home / Self Care | Payer: Medicaid Other | Source: Ambulatory Visit | Attending: Emergency Medicine | Admitting: Emergency Medicine

## 2020-07-21 ENCOUNTER — Emergency Department: Payer: Medicaid Other

## 2020-07-21 DIAGNOSIS — R079 Chest pain, unspecified: Secondary | ICD-10-CM

## 2020-07-21 DIAGNOSIS — R9431 Abnormal electrocardiogram [ECG] [EKG]: Secondary | ICD-10-CM

## 2020-07-21 DIAGNOSIS — Z789 Other specified health status: Secondary | ICD-10-CM

## 2020-07-21 NOTE — ED Notes (Signed)
Pt presents with CP that started yesterday at 7pm, lasted , has since resolved. States he has had episodes of CP in the past that resolved on their own. Reports some SOB with CP. VSS on tele, will d/c home per orders.

## 2020-07-21 NOTE — ED Notes (Signed)
Pt called to be roomed. Pt not found to be in WR or outside.

## 2020-07-21 NOTE — Discharge Instructions (Signed)
You were seen in the Emergency Department for chest pain. It was determined that there was no need for hospitalization at this time and the best course of action is follow up with your primary doctor 24-48 hours.   Instructions: Follow up with your primary doctor in 24 - 48 hours. Please take an over the counter pain medication for pain as well as use ice or heating pad for comfort to the affected area. You should always discuss new medications with your primary care physician.   Return to the Emergency Department if your symptoms persist or worsen, especially if you experience severe increase in pain, shortness of breath, chest pain, headache, changes in vision, nausea, vomiting, fever, numbness, inability to walk, inability to eat or drink, or changes in behavior. Also please feel free to return to the ED for reevaluation at anytime you feel the condition warrants such a visit or you are unable contact your primary care doctor.

## 2020-07-21 NOTE — ED Provider Notes (Signed)
History     Chief Complaint   Patient presents with    Chest Pain     34 year old male presenting to the ED with chief complaint of chest pain.  Patient states the chest pain started while driving.  Patient states it was in the center of the chest and felt like it came up from the lower chest and into the left chest.  Patient states this has happened in the past.  Patient states currently is without chest pain.  Patient denies anything that makes it better or worse.  Patient denies any nausea or vomiting.  Patient denies any numbness or weakness.  Patient denies any fevers or chills.  Patient denies any recent illness.      History provided by:  Patient      Medical/Surgical/Family History     No past medical history on file.     Patient Active Problem List   Diagnosis Code    Other chest pain R07.89    Obesity, unspecified E66.9    Hyperlipidemia, unspecified E78.5    GERD (gastroesophageal reflux disease) K21.9    Prediabetes R73.03            Past Surgical History:   Procedure Laterality Date    ORIF right tibia Right      Family History   Problem Relation Age of Onset    Heart Disease Mother     High Blood Pressure Mother     Substance abuse Mother     No Known Problems Sister     No Known Problems Brother     Substance abuse Maternal Grandmother           Social History     Tobacco Use    Smoking status: Current Every Day Smoker     Packs/day: 0.50     Types: Cigarettes    Smokeless tobacco: Never Used   Substance Use Topics    Alcohol use: Yes     Comment: 1-2 times month    Drug use: No     Comment: previous use x 3 weeks     Living Situation     Questions Responses    Patient lives with Spouse    Homeless No    Caregiver for other family member     External Services     Employment Disabled    Domestic Violence Risk                 Review of Systems   Review of Systems   Constitutional: Negative for activity change, appetite change, chills and fever.   HENT: Negative for congestion,  rhinorrhea, sore throat and trouble swallowing.    Eyes: Negative for pain and visual disturbance.   Respiratory: Negative for cough, chest tightness, shortness of breath and wheezing.    Cardiovascular: Positive for chest pain.   Gastrointestinal: Negative for abdominal pain, nausea and vomiting.   Genitourinary: Negative for dysuria.   Musculoskeletal: Negative for back pain, neck pain and neck stiffness.   Skin: Negative for rash.   Neurological: Negative for dizziness, seizures, syncope, weakness, light-headedness, numbness and headaches.   Psychiatric/Behavioral: Negative for agitation.       Physical Exam     Triage Vitals  Triage Start: Start, (07/20/20 2128)   First Recorded BP: 140/82, Resp: 18, Temp: 36.4 C (97.5 F), Temp src: TEMPORAL Oxygen Therapy SpO2: 98 %, Oximetry Source: Rt Hand, O2 Device: None (Room air), Heart Rate: 92, (07/20/20 2129)  .  First Pain Reported  0-10 Scale: 7, (07/20/20 2129)       Physical Exam  Vitals and nursing note reviewed.   Constitutional:       Appearance: Normal appearance. He is well-developed. He is not ill-appearing or diaphoretic.      Comments: Pt appears comfortable   HENT:      Head: Normocephalic and atraumatic.      Right Ear: Hearing and external ear normal.      Left Ear: Hearing and external ear normal.      Nose: Nose normal.      Mouth/Throat:      Mouth: Mucous membranes are not pale and not dry.   Eyes:      General: Lids are normal. No scleral icterus.        Right eye: No discharge.         Left eye: No discharge.      Conjunctiva/sclera: Conjunctivae normal.      Pupils: Pupils are equal, round, and reactive to light.   Cardiovascular:      Rate and Rhythm: Normal rate and regular rhythm.      Heart sounds: Normal heart sounds, S1 normal and S2 normal. Heart sounds not distant. No murmur heard.  No friction rub. No gallop.    Pulmonary:      Effort: Pulmonary effort is normal.      Breath sounds: Normal breath sounds. No decreased breath sounds,  wheezing, rhonchi or rales.   Chest:      Chest wall: No tenderness.   Breasts:      Right: No supraclavicular adenopathy.      Left: No supraclavicular adenopathy.       Abdominal:      General: Bowel sounds are normal. There is no distension.      Palpations: Abdomen is soft. Abdomen is not rigid.      Tenderness: There is no abdominal tenderness. There is no guarding or rebound.   Musculoskeletal:         General: No tenderness. Normal range of motion.      Cervical back: Normal range of motion. No spinous process tenderness or muscular tenderness.   Lymphadenopathy:      Cervical: No cervical adenopathy.      Upper Body:      Right upper body: No supraclavicular adenopathy.      Left upper body: No supraclavicular adenopathy.   Skin:     General: Skin is warm and dry.      Findings: No rash.   Neurological:      Mental Status: He is alert and oriented to person, place, and time.      GCS: GCS eye subscore is 4. GCS verbal subscore is 5. GCS motor subscore is 6.      Sensory: No sensory deficit.      Motor: No tremor, abnormal muscle tone or seizure activity.      Coordination: Coordination normal.      Comments: Alert, Following commands, Moving all 4 extremities   Psychiatric:         Speech: Speech normal.         Behavior: Behavior normal. Behavior is cooperative.         Thought Content: Thought content normal.         Judgment: Judgment normal.         Medical Decision Making   Patient seen by me on:  07/20/2020    Assessment:  34 year old male  presenting ED with chief complaint of chest pain.    Differential diagnosis:  Male, Dysrhythmia, Conduction abnormality, Pleural Effusion, Pneumothorax, Musculoskeletal, URI, Bronchitis, electrolyte abnormality, Viral illness.  Likely ACS in the setting of clinical exam history.      Plan:  Chest x-ray, EKG, Reassurance, Reevaluation. Ultimate disposition pending ED work up, radiology, and symptom resolution. Pt will likely be d/c home in the setting of normal work up  and resolution of symptoms. Pt will be encouraged to follow up with their PCP for further evaluation of continuing symptoms.     ED Course and Disposition:  Patient's EKG does not show any signs of acute ischemia, patient's chest x-ray shows no acute cause for patient's symptoms.  Patient is encouraged to continue following up with his primary care physician patient was referred to cardiology.  Patient also encouraged to discontinue smoking as this can be a cause of his current complaints.            [address] DO          Ileah Falkenstein, [place], [place]  07/21/20 [phone]

## 2020-07-24 ENCOUNTER — Telehealth: Payer: Self-pay

## 2020-07-24 ENCOUNTER — Encounter: Payer: Self-pay | Admitting: Cardiology

## 2020-07-24 NOTE — Telephone Encounter (Signed)
S/w pt, he calls to report that he continues to have "the same symptoms as when he was seen last". He reports random chest tightness, left arm discomfort, lightheadedness, can last 20-30 min or be "a quick flash", getting up and moving around "seems to help resolve it" but he is concerned that it has been persistent. Pt does DENY SOB, swelling or symptoms with exertion.    Pt asking for repeat stress echo, would come in to see you as well if you wish, last seen and tested 2019. Please advise

## 2020-07-24 NOTE — Telephone Encounter (Signed)
Ok for revisit. Can fit in 2 weeks. Thank you, nv

## 2020-07-24 NOTE — Telephone Encounter (Signed)
Ginger Organ routed conversation to Rcpg Rn Team Just now (12:23 PM)   [name]  Patient Appointment Schedule Request Pool 2 hours ago (9:42 AM)     Appointment Request From: [name]    With Provider: Annamarie Dawley. Lyndel Pleasure, MD [facility] ]    Preferred Date Range: 07/29/2020 - 08/02/2020    Preferred Times: Monday Morning, Tuesday Morning, Wednesday Morning, Thursday Morning, Friday Morning    Reason for visit: Request a New Problem Visit    Comments

## 2020-07-25 NOTE — Telephone Encounter (Signed)
S/w patient and scheduled. Done.

## 2020-07-29 ENCOUNTER — Encounter: Payer: Self-pay | Admitting: Emergency Medicine

## 2020-07-29 ENCOUNTER — Encounter: Payer: Self-pay | Admitting: Cardiology

## 2020-07-29 ENCOUNTER — Ambulatory Visit: Payer: Medicaid Other | Admitting: Cardiology

## 2020-07-29 VITALS — BP 130/72 | HR 99 | Ht 70.0 in | Wt 295.0 lb

## 2020-07-29 DIAGNOSIS — R9431 Abnormal electrocardiogram [ECG] [EKG]: Secondary | ICD-10-CM

## 2020-07-29 DIAGNOSIS — Z789 Other specified health status: Secondary | ICD-10-CM

## 2020-07-29 DIAGNOSIS — R079 Chest pain, unspecified: Secondary | ICD-10-CM

## 2020-07-29 NOTE — Progress Notes (Signed)
Cardiology Follow-Up Visit    Name: James Price PCP: Egbert Garibaldi, MD   DOB: 08/03/1986 Date of Visit: 07/29/2020     History of Present Illness   Reason for Visit: Chest pain    [name] is a 34 y.o. man with no prior cardiac history who presents for evaluation of chest pain.    The patient was last evaluated 2 years ago.  He presents today to discuss pressing/pressure on on his chest with radiation down the left arm.  His symptoms occur on most days and seems to come without warning or exacerbating factors.  Often times he is at rest, just relaxing.  He has gained about 40 pounds.  If he lays down the discomfort is more prominently felt and when he stands up the discomfort tends to improve.  He describes some shortness of breath which she relates to smoking.  Endorses one episode of palpitations, but nothing consistent.  Smoking a little less than 1 pack/day.  Occasionally drinks alcohol.  Reviewed family history again, siblings still without any cardiac issues.  Recently placed on PPI which has helped with burning upper abdominal discomfort but not the chest discomfort.    Past Medical, Surgical, Family and Social History   No past medical history on file.  Past Surgical History:   Procedure Laterality Date    ORIF right tibia Right      Family History   Problem Relation Age of Onset    Heart Disease Mother     High Blood Pressure Mother     Substance abuse Mother     No Known Problems Sister     No Known Problems Brother     Substance abuse Maternal Grandmother      Social History     Socioeconomic History    Marital status: Single     Spouse name: Not on file    Number of children: 3    Years of education: Not on file    Highest education level: Not on file   Tobacco Use    Smoking status: Current Every Day Smoker     Packs/day: 0.50     Types: Cigarettes    Smokeless tobacco: Never Used   Substance and Sexual Activity    Alcohol use: Yes     Comment: 1-2 times month     Drug use: No     Comment: previous use x 3 weeks    Sexual activity: Yes     Partners: Female   Other Topics Concern    Not on file   Social History Narrative    Lives with Dad       Review of Systems   A 10+ system review was conducted and was negative except as above    Medications and Allergies     Patient's Medications   New Prescriptions    No medications on file   Previous Medications    ASPIRIN 325 MG TABLET    Take 325 mg by mouth daily    IBUPROFEN (ADVIL,MOTRIN) 600 MG TABLET    Take 1 tablet (600 mg total) by mouth 3 times daily as needed for Pain    OMEPRAZOLE (PRILOSEC) 20 MG CAPSULE    Take 1 capsule (20 mg total) by mouth daily (before breakfast)   Modified Medications    No medications on file   Discontinued Medications    No medications on file     Allergies   Allergen Reactions    Shellfish-Derived  Products Itching       Vitals and Physical Exam     VS:  BP 130/72    Pulse 99    Ht 1.778 m (5\' 10" )    Wt 133.8 kg (295 lb)    SpO2 98%    BMI 42.33 kg/m    Gen:  Appears comfortable  HENT:  NC/AT, neck supple, JVP not distended  Resp:  CTA  CV:  RRR, . No murmurs, rubs, or gallops. No lower extremity edema  GI:  Abdomen is soft, non-tender, non-distended  Msk:  No cyanosis, no clubbing, no joint tenderness or erythema  Skin:  No rashes    Laboratory Data     Lab Results   Component Value Date    CREAT 1.03 07/12/2020    UN 9 07/12/2020    NA 140 07/12/2020    K 4.3 07/12/2020    CL 102 07/12/2020    CO2 26 07/12/2020         Lab results: 07/12/20  1031   Cholesterol 191   HDL 34*   LDL Calculated 132*   Triglycerides 124   Non HDL Cholesterol 157   Chol/HDL Ratio 5.6     Lab Results   Component Value Date    HA1C 6.1 (H) 07/12/2020       Cardiac/Imaging Data     ECG: NSR, nonspecific T wave flattening in leads V4-V6, 1, aVL         EXERCISE STRESS ECHO COMPLETE 08/29/2018    Interpretation Summary  1. Normal LV size, mass, and EF without regional wall motion abnormalities  2. No significant  valvular abnormalities  3. Normal right heart size and function with estimated normal pulmonary artery systolic pressure  4. Positive saline contrast study for right to left shunting  5. No ischemic ECG changes or arrhythmias with exercise  6. Negative exercise stress echo for myocardial ischemia at the diagnostic workload achieved              Orders Placed This Encounter   Procedures    EKG 12 lead       Impression & Plan     34 y.o. man with no prior cardiac history who presents for evaluation of chest pain.    The patient presents with fairly atypical chest discomfort similar in quality to the symptoms he was experiencing 2 years ago.  Nonetheless given the persistence of his symptoms I think is reasonable to proceed with exercise stress testing to rule out cardiac etiology.  Have ordered an exercise stress echocardiogram to be completed in the near future.  Further recommendations pending those results.         31, MD  Cardiovascular Disease  [facility]

## 2020-07-30 LAB — EKG 12-LEAD
P: 60 deg
PR: 161 ms
QRS: 28 deg
QRSD: 85 ms
QT: 349 ms
QTc: 420 ms
Rate: 87 {beats}/min
T: 43 deg

## 2020-07-31 LAB — EKG 12-LEAD
P: 55 deg
PR: 172 ms
QRS: 44 deg
QRSD: 87 ms
QT: 376 ms
QTc: 426 ms
Rate: 77 {beats}/min
T: 65 deg

## 2020-08-02 ENCOUNTER — Ambulatory Visit
Admission: RE | Admit: 2020-08-02 | Discharge: 2020-08-02 | Disposition: A | Discharge status: Home / Self Care | Payer: Medicaid Other | Source: Ambulatory Visit | Attending: Cardiology | Admitting: Cardiology

## 2020-08-02 DIAGNOSIS — R079 Chest pain, unspecified: Secondary | ICD-10-CM | POA: Insufficient documentation

## 2020-08-02 MED ORDER — PERFLUTREN PROTEIN A MICROSPH (OPTISON) IV SUSP *I*
1.5000 mL | INTRAVENOUS | Status: DC | PRN
Start: 2020-08-02 — End: 2020-08-03
  Filled 2020-08-02: qty 3

## 2020-08-02 NOTE — Discharge Instructions (Signed)
The test done today will be read by your cardiologist.  If you do not hear from us within 2 business days, please call our office at [phone] to get your results.  If you have MyChart, the results will be posted within 7 days.

## 2020-08-05 ENCOUNTER — Encounter: Payer: Self-pay | Admitting: Cardiology

## 2020-08-05 LAB — EXERCISE STRESS ECHO COMPLETE
Aortic Arch Diameter: 2.8 cm
Aortic Diameter (mid tubular): 2.9 cm
Aortic Diameter (sinus of Valsalva): 3 cm
BMI: 42.4 kg/m2
BP Diastolic: 70 mmHg
BP Systolic: 120 mmHg
BSA: 2.57 m2
E/A ratio: 1.71
Echo RV Stroke Work Index Estimate: 679.7 mmHg•mL/m2
Estimated workload: 7 METS
Heart Rate: 77 {beats}/min
Height: 70 in
LA Systolic Diameter: 4.05 cm
LA Systolic Vol BSA Index: 25.6 mL/m2
LA Systolic Vol Height Index: 37 mL/m
LA Systolic Volume: 65.7 mL
LV ASE Mass BSA Index: 82.3 gm/m2
LV ASE Mass Height 2.7 Index: 44.7 gm/m2.7
LV ASE Mass Height Index: 119 gm/m
LV ASE Mass: 211.6 gm
LV CO BSA Index: 2.38 L/min/m2
LV Cardiac Output: 6.11 L/min
LV Diastolic Volume Index: 52.9 mL/m2
LV Posterior Wall Thickness: 0.99 cm
LV SV - LVOT SV Diff: -0.16 mL
LV SV - LVOT SV Diff: -0.21 %
LV SV BSA Index: 30.9 mL/m2
LV SV Height Index: 44.7 mL/m
LV Septal Thickness: 1.05 cm
LV Stroke Volume: 79.4 mL
LV Systolic Volume Index: 22 mL/m2
LV wall/cavity ratio: 0.38
LVED Diameter BSA Index: 2.1 cm/m2
LVED Diameter Height Index: 3.03 cm/m
LVED Diameter: 5.39 cm
LVED Volume BSA Index: 52.9 mL/m2
LVED Volume BSA Index: 53 ml/m2
LVED Volume Height Index: 76.5 mL/m
LVED Volume: 136 mL
LVEF (Volume): 58 %
LVES Diameter BSA Index: 1.48 cm/m2
LVES Diameter Height Index: 2.14 cm/m
LVES Diameter: 3.8 cm
LVES Volume BSA Index: 22 mL/m2
LVES Volume BSA Index: 22 ml/m2
LVES Volume Height Index: 31.8 mL/m
LVES Volume: 56.6 mL
LVOT Area (calculated): 3.94 cm2
LVOT Cardiac Index: 2.38 L/min/m2
LVOT Cardiac Output: 6.13 L/min
LVOT Diameter: 2.24 cm
LVOT PWD VTI: 20.2 cm
LVOT PWD Velocity (mean): 65.1 cm/s
LVOT PWD Velocity (peak): 93.3 cm/s
LVOT SV BSA Index: 30.96 mL/m2
LVOT SV Height Index: 44.7 mL/m
LVOT Stroke Rate (mean): 256.4 mL/s
LVOT Stroke Rate (peak): 367.5 mL/s
LVOT Stroke Volume: 79.56 cc
MPHR: 185 {beats}/min
MR Regurgitant Fraction (LV SV Mtd): 0
MR Regurgitant Volume (LV SV Mtd): -0.2 mL
MV Peak A Velocity: 39 cm/s
MV Peak E Velocity: 66.5 cm/s
Mitral Annular E/Ea Vel Ratio: 5.08
Mitral Annular Ea Velocity: 13.1 cm/s
Peak DBP: 90 mmHg
Peak Gradient - TR: 22 mmHg
Peak HR: 150 {beats}/min
Peak SBP: 180 mmHg
Percent MPHR: 81.1 %
Pulmonary Vascular Resistance Estimate: 3.6 mmHg
RA Pressure Estimate: 3 mmHg
RA Volume BSA Index: 20.2 mL/m2
RA Volume Height Index: 29.1 mL/m
RA Volume: 51.8 mL
RPP: 27000 BPM x mmHG
RR Interval: 779.22 ms
RV Peak Systolic Pressure: 25 mmHg
Stress Peak Stage: 2
Stress duration (min): 6 min
Stress duration (sec): 0 s
Tricuspid Annular Systolic Plane Excursion (TAPSE): 2.45 cm
Weight (lbs): 295 [lb_av]
Weight: 4720 oz

## 2020-08-06 ENCOUNTER — Encounter: Payer: Self-pay | Admitting: Emergency Medicine

## 2020-08-06 ENCOUNTER — Emergency Department: Payer: Medicaid Other

## 2020-08-06 ENCOUNTER — Emergency Department
Admission: EM | Admit: 2020-08-06 | Discharge: 2020-08-06 | Disposition: A | Discharge status: Home / Self Care | Payer: Medicaid Other | Source: Ambulatory Visit | Attending: Emergency Medicine | Admitting: Emergency Medicine

## 2020-08-06 DIAGNOSIS — F17210 Nicotine dependence, cigarettes, uncomplicated: Secondary | ICD-10-CM | POA: Insufficient documentation

## 2020-08-06 DIAGNOSIS — R079 Chest pain, unspecified: Secondary | ICD-10-CM

## 2020-08-06 DIAGNOSIS — R9431 Abnormal electrocardiogram [ECG] [EKG]: Secondary | ICD-10-CM

## 2020-08-06 DIAGNOSIS — Z789 Other specified health status: Secondary | ICD-10-CM

## 2020-08-06 LAB — CBC AND DIFFERENTIAL
Baso # K/uL: 0.1 10*3/uL (ref 0.0–0.1)
Basophil %: 1.1 %
Eos # K/uL: 0.7 10*3/uL — ABNORMAL HIGH (ref 0.0–0.5)
Eosinophil %: 7 %
Hematocrit: 49 % (ref 40–51)
Hemoglobin: 15.9 g/dL (ref 13.7–17.5)
IMM Granulocytes #: 0 10*3/uL (ref 0.0–0.0)
IMM Granulocytes: 0.3 %
Lymph # K/uL: 2.9 10*3/uL (ref 1.3–3.6)
Lymphocyte %: 29.7 %
MCH: 29 pg (ref 26–32)
MCHC: 33 g/dL (ref 32–37)
MCV: 89 fL (ref 79–92)
Mono # K/uL: 0.9 10*3/uL — ABNORMAL HIGH (ref 0.3–0.8)
Monocyte %: 9.3 %
Neut # K/uL: 5.2 10*3/uL (ref 1.8–5.4)
Nucl RBC # K/uL: 0 10*3/uL (ref 0.0–0.0)
Nucl RBC %: 0 /100 WBC (ref 0.0–0.2)
Platelets: 337 10*3/uL — ABNORMAL HIGH (ref 150–330)
RBC: 5.5 MIL/uL (ref 4.6–6.1)
RDW: 14.6 % — ABNORMAL HIGH (ref 11.6–14.4)
Seg Neut %: 52.6 %
WBC: 9.8 10*3/uL — ABNORMAL HIGH (ref 4.2–9.1)

## 2020-08-06 LAB — BASIC METABOLIC PANEL
Anion Gap: 9 (ref 7–16)
CO2: 25 mmol/L (ref 20–28)
Calcium: 9 mg/dL (ref 9.0–10.3)
Chloride: 102 mmol/L (ref 96–108)
Creatinine: 0.88 mg/dL (ref 0.67–1.17)
GFR,Black: 128 *
GFR,Caucasian: 111 *
Glucose: 116 mg/dL — ABNORMAL HIGH (ref 60–99)
Lab: 9 mg/dL (ref 6–20)
Potassium: 4 mmol/L (ref 3.3–5.1)
Sodium: 136 mmol/L (ref 133–145)

## 2020-08-06 LAB — D-DIMER, QUANTITATIVE: D-Dimer: 0.23 ug/mL FEU (ref 0.00–0.50)

## 2020-08-06 MED ORDER — IBUPROFEN 600 MG PO TABS *I*
600.0000 mg | ORAL_TABLET | Freq: Once | ORAL | Status: AC
Start: 2020-08-06 — End: 2020-08-06
  Administered 2020-08-06: 600 mg via ORAL
  Filled 2020-08-06: qty 1

## 2020-08-06 MED ORDER — DEXTROSE 5 % FLUSH FOR PUMPS *I*
0.0000 mL/h | INTRAVENOUS | Status: DC | PRN
Start: 2020-08-06 — End: 2020-08-07

## 2020-08-06 MED ORDER — SODIUM CHLORIDE 0.9 % FLUSH FOR PUMPS *I*
0.0000 mL/h | INTRAVENOUS | Status: DC | PRN
Start: 2020-08-06 — End: 2020-08-07

## 2020-08-06 NOTE — ED Triage Notes (Signed)
Pt states his chest "feels like it hurt and is tight" since 1000 this morning.  States it has happened before and every time he comes he is told nothing is wrong.  EKG done at triage.       Triage Note   Fredric Mare, RN

## 2020-08-06 NOTE — ED Provider Notes (Signed)
History     Chief Complaint   Patient presents with    Chest Pain     34 year old male with history of GERD, hyperlipidemia presents with chest pain.  He states that he developed left upper chest pain at 10 AM today.  He states that his pain improves with sleep.  He denies associated fevers or cough.  He reports that this chest pain is similar to prior episodes of chest pain that have brought on into the emergency department.    He had a reassuring stress echocardiogram on 9/17.    He denies lower extremity edema or pain.  He states that he does have a sister who has a history of clots in her leg.          Medical/Surgical/Family History     History reviewed. No pertinent past medical history.     Patient Active Problem List   Diagnosis Code    Other chest pain R07.89    Obesity, unspecified E66.9    Hyperlipidemia, unspecified E78.5    GERD (gastroesophageal reflux disease) K21.9    Prediabetes R73.03            Past Surgical History:   Procedure Laterality Date    ORIF right tibia Right      Family History   Problem Relation Age of Onset    Heart Disease Mother     High Blood Pressure Mother     Substance abuse Mother     No Known Problems Sister     No Known Problems Brother     Substance abuse Maternal Grandmother           Social History     Tobacco Use    Smoking status: Current Every Day Smoker     Packs/day: 0.50     Types: Cigarettes    Smokeless tobacco: Never Used   Substance Use Topics    Alcohol use: Yes     Comment: 1-2 times month    Drug use: No     Comment: previous use x 3 weeks     Living Situation     Questions Responses    Patient lives with Spouse    Homeless No    Caregiver for other family member     External Services     Employment Disabled    Domestic Violence Risk                 Review of Systems   Review of Systems   Constitutional: Negative for fever.   HENT: Negative for congestion.    Eyes: Negative for visual disturbance.   Respiratory: Negative for shortness of  breath.    Cardiovascular: Positive for chest pain.   Gastrointestinal: Negative for abdominal pain.   Genitourinary: Negative for flank pain.   Musculoskeletal: Negative for back pain.   Skin: Negative for rash.   Neurological: Negative for headaches.       Physical Exam     Triage Vitals  Triage Start: Start, (08/06/20 1156)   First Recorded BP: 137/84, Resp: 18, Temp: 35.6 C (96.1 F), Temp src: TEMPORAL Oxygen Therapy SpO2: 96 %, O2 Device: None (Room air), Heart Rate: 87, (08/06/20 1158)  .  First Pain Reported  0-10 Scale: 10, Pain Location/Orientation: Chest, (08/06/20 1158)       Physical Exam  Vitals reviewed.   Constitutional:       Appearance: He is not diaphoretic.   HENT:      Head:  Normocephalic.      Mouth/Throat:      Comments: Mask in place  Eyes:      Pupils: Pupils are equal, round, and reactive to light.   Cardiovascular:      Rate and Rhythm: Normal rate and regular rhythm.      Comments: No LE edema or tenderness  Pulmonary:      Effort: Pulmonary effort is normal.      Breath sounds: Normal breath sounds.   Abdominal:      General: Bowel sounds are normal. There is no distension.      Palpations: Abdomen is soft.      Tenderness: There is no abdominal tenderness.   Musculoskeletal:      Cervical back: Normal range of motion.   Skin:     General: Skin is warm and dry.   Neurological:      Mental Status: He is alert.   Psychiatric:         Mood and Affect: Mood normal.         Medical Decision Making   Patient seen by me on:  08/06/2020    Assessment:  34 year old male presents with chest pain    Differential diagnosis:  Musculoskeletal pain, possible infection.  Recent reassuring stress echocardiogram.  Possible PE given family history.    Plan:  EKG, chest x-ray, labs including D-dimer reassuring.  Plan for discharge home for outpatient follow-up.  Given warning signs for return to the ED.              Si Raider, MD          Si Raider, MD  08/06/20 2239

## 2020-08-06 NOTE — Discharge Instructions (Signed)
You came into the emergency department with chest pain.    Your imaging studies and blood tests were reassuring.      Follow-up with your primary physician this week.    Return to the emergency department with uncontrolled pain, fever, worsening symptoms, or with any other concerns.

## 2020-08-08 ENCOUNTER — Encounter: Payer: Self-pay | Admitting: Family Medicine

## 2020-08-08 ENCOUNTER — Ambulatory Visit: Payer: Medicaid Other | Admitting: Family Medicine

## 2020-08-08 VITALS — BP 123/82 | HR 92 | Temp 96.8°F | Ht 70.0 in | Wt 297.0 lb

## 2020-08-08 DIAGNOSIS — E785 Hyperlipidemia, unspecified: Secondary | ICD-10-CM

## 2020-08-08 DIAGNOSIS — F17200 Nicotine dependence, unspecified, uncomplicated: Secondary | ICD-10-CM

## 2020-08-08 DIAGNOSIS — R7303 Prediabetes: Secondary | ICD-10-CM

## 2020-08-08 DIAGNOSIS — K219 Gastro-esophageal reflux disease without esophagitis: Secondary | ICD-10-CM

## 2020-08-08 DIAGNOSIS — E669 Obesity, unspecified: Secondary | ICD-10-CM

## 2020-08-08 DIAGNOSIS — R079 Chest pain, unspecified: Secondary | ICD-10-CM

## 2020-08-08 DIAGNOSIS — F17210 Nicotine dependence, cigarettes, uncomplicated: Secondary | ICD-10-CM

## 2020-08-08 MED ORDER — NICOTINE POLACRILEX 4 MG MT GUM *I*
CHEWING_GUM | OROMUCOSAL | 0 refills | Status: DC
Start: 2020-08-08 — End: 2020-08-15

## 2020-08-08 NOTE — Progress Notes (Signed)
Subjective    History:    James Price is a 34 y.o. male who presents for Lab Results (fuv from bood work ) and Chest Pain (fuv / still having the same problem )      HPI     Chest pain  Ongoing problem. Several ED visits, has also been evaluated by cardiology. Reassuring stress echocardiogram on 9/17.   Continues to "feeling like something is pushing down on chest" and intermittent sharp pain.  Sometimes CP is reproducible, not always.  Can sometime provoke CP with moving arm behind back.  Recently treated for GERD with omeprazole. Helped GERD, no improvement in chest pain.    Prediabetes  New diagnosis  Drinks coffee with sugar daily.  Also drinks soda daily.  Yesterday had several meals with carbs-french toast for breakfast, fried chicken and pasta for dinner.    Hyperlipidemia  Established problem.  Improved over the last 2 years.  Near goal    Nicotine dependence  Previously smoking 1/2 pack a day, down to 7 cigarettes a day.     Stress echo 07/2020    1. Normal LV size, mass, and EF without regional wall motion   abnormalities   2. No significant valvular abnormalities   3. Normal right heart size and function with estimated normal pulmonary   artery systolic pressure   4. No ischemic ECG changes or arrhythmias with exercise   5. Negative exercise stress echo for myocardial ischemia at the cardiac   workload achieved          Objective   Examination:  Vitals:    08/08/20 1057 08/08/20 1127   BP: 145/71 123/82   Pulse: 89 92   Temp: 36 C (96.8 F)    TempSrc: Temporal    Weight: 134.7 kg (297 lb)    Height: 1.778 m (5\' 10" )      Body mass index is 42.62 kg/m.      Wt Readings from Last 3 Encounters:   08/08/20 134.7 kg (297 lb)   08/06/20 134.3 kg (296 lb)   08/02/20 133.8 kg (295 lb)     BP Readings from Last 3 Encounters:   08/08/20 123/82   08/06/20 133/77   08/02/20 102/70     Physical Exam    General: Pleasant and cooperative, clean, appropriately dressed, A&Ox3. Initiated conversation,  actively engaged in encounter. NAD ; talking in complete sentences  Neuro: Alert and oriented  CV: RRR, no murmur, rub or gallop, normal S1 and S2. Unable to reproduce CP with palpation or arm movement.  Lungs: clear to auscultation bilaterally with no rales, wheezes, or rhonchi. Effort normal. No respiratory distress.  Psychiatric: Normal mood and affect. Behavior is normal. Thought content normal.         Assessment         Assessment/Plan:    1. Chest pain, unspecified type  Established problem  Reassuring stress echo, sometimes producible with palpation and arm movement.  Likely MSK in nature. Advised to trial NSAID TID X 1 week to see if there is improvement. Will follow-up in 4 weeks, if no improvement will refer to PT.    2. Gastroesophageal reflux disease, unspecified whether esophagitis present  Improved on omeprazole, continue for now.    3. Prediabetes  4. Hyperlipidemia, unspecified hyperlipidemia type  New problem.   - Discussed blood work results, explained diagnosis, and answered all questions.   - Counseled on weight loss and increased physical activity to help prevent onset of  diabetes. Recommended at least 150 minutes of moderate activity (ex. Walking) per week.  - Repeat blood work in 3-6 months.    5.  Nicotine dependence, unspecified, uncomplicated  - More than three minutes of smoking cessation counseling done.  Patient co-morbidities include preDM and hyperlipidemia    Risks of smoking reviewed including possible lung disease (COPD, cancer) and cardiac disease.  Options of cessation aides reviewed including nicotine patch, hypnosis, and possible prescription medications.  Patient encouraged to follow-up with outpatient physician.    - nicotine polacrilex (NICORETTE) 4 MG gum; for Nicotine Addiction Max dose 24 pieces/24 hours; avoid food/drink 15 mins before or after use  Dispense: 100 each; Refill: 0       Return in about 4 weeks (around 09/05/2020) for Follow-up.               Georgeanne Nim, NP   [facility]  [address] [place]  [place] [address]  Dept: [phone]  Dept Fax: [phone]

## 2020-08-10 ENCOUNTER — Emergency Department
Admission: EM | Admit: 2020-08-10 | Discharge: 2020-08-10 | Disposition: A | Discharge status: Home / Self Care | Payer: Medicaid Other | Source: Ambulatory Visit | Attending: Student in an Organized Health Care Education/Training Program | Admitting: Student in an Organized Health Care Education/Training Program

## 2020-08-10 ENCOUNTER — Encounter: Payer: Self-pay | Admitting: Student in an Organized Health Care Education/Training Program

## 2020-08-10 DIAGNOSIS — F17210 Nicotine dependence, cigarettes, uncomplicated: Secondary | ICD-10-CM | POA: Insufficient documentation

## 2020-08-10 DIAGNOSIS — I4891 Unspecified atrial fibrillation: Secondary | ICD-10-CM

## 2020-08-10 DIAGNOSIS — R079 Chest pain, unspecified: Secondary | ICD-10-CM | POA: Insufficient documentation

## 2020-08-10 DIAGNOSIS — I480 Paroxysmal atrial fibrillation: Secondary | ICD-10-CM

## 2020-08-10 DIAGNOSIS — R072 Precordial pain: Secondary | ICD-10-CM

## 2020-08-10 DIAGNOSIS — R0602 Shortness of breath: Secondary | ICD-10-CM

## 2020-08-10 DIAGNOSIS — R42 Dizziness and giddiness: Secondary | ICD-10-CM | POA: Insufficient documentation

## 2020-08-10 LAB — CBC AND DIFFERENTIAL
Baso # K/uL: 0.1 10*3/uL (ref 0.0–0.1)
Basophil %: 1.1 %
Eos # K/uL: 0.7 10*3/uL — ABNORMAL HIGH (ref 0.0–0.5)
Eosinophil %: 6 %
Hematocrit: 50 % (ref 40–51)
Hemoglobin: 16.6 g/dL (ref 13.7–17.5)
IMM Granulocytes #: 0 10*3/uL (ref 0.0–0.0)
IMM Granulocytes: 0.2 %
Lymph # K/uL: 2.7 10*3/uL (ref 1.3–3.6)
Lymphocyte %: 23.7 %
MCH: 29 pg (ref 26–32)
MCHC: 33 g/dL (ref 32–37)
MCV: 88 fL (ref 79–92)
Mono # K/uL: 1 10*3/uL — ABNORMAL HIGH (ref 0.3–0.8)
Monocyte %: 8.5 %
Neut # K/uL: 6.8 10*3/uL — ABNORMAL HIGH (ref 1.8–5.4)
Nucl RBC # K/uL: 0 10*3/uL (ref 0.0–0.0)
Nucl RBC %: 0 /100 WBC (ref 0.0–0.2)
Platelets: 344 10*3/uL — ABNORMAL HIGH (ref 150–330)
RBC: 5.7 MIL/uL (ref 4.6–6.1)
RDW: 14.5 % — ABNORMAL HIGH (ref 11.6–14.4)
Seg Neut %: 60.5 %
WBC: 11.2 10*3/uL — ABNORMAL HIGH (ref 4.2–9.1)

## 2020-08-10 LAB — BASIC METABOLIC PANEL
Anion Gap: 11 (ref 7–16)
CO2: 25 mmol/L (ref 20–28)
Calcium: 9.2 mg/dL (ref 9.0–10.3)
Chloride: 104 mmol/L (ref 96–108)
Creatinine: 0.88 mg/dL (ref 0.67–1.17)
GFR,Black: 128 *
GFR,Caucasian: 111 *
Glucose: 139 mg/dL — ABNORMAL HIGH (ref 60–99)
Lab: 9 mg/dL (ref 6–20)
Potassium: 4.2 mmol/L (ref 3.3–5.1)
Sodium: 140 mmol/L (ref 133–145)

## 2020-08-10 LAB — MAGNESIUM: Magnesium: 1.9 mg/dL (ref 1.6–2.5)

## 2020-08-10 LAB — HOLD GREEN WITH GEL

## 2020-08-10 LAB — HOLD GRAY

## 2020-08-10 LAB — HOLD BLUE

## 2020-08-10 LAB — BLOOD BANK HOLD LAVENDER

## 2020-08-10 LAB — BLOOD BANK HOLD RED

## 2020-08-10 LAB — TSH: TSH: 0.8 u[IU]/mL (ref 0.27–4.20)

## 2020-08-10 MED ORDER — METOPROLOL TARTRATE 25 MG PO TABS *I*
12.5000 mg | ORAL_TABLET | Freq: Two times a day (BID) | ORAL | 0 refills | Status: DC
Start: 2020-08-10 — End: 2020-08-14

## 2020-08-10 MED ORDER — DEXTROSE 5 % FLUSH FOR PUMPS *I*
0.0000 mL/h | INTRAVENOUS | Status: DC | PRN
Start: 2020-08-10 — End: 2020-08-10

## 2020-08-10 MED ORDER — SODIUM CHLORIDE 0.9 % IV BOLUS *I*
1000.0000 mL | Freq: Once | Status: AC
Start: 2020-08-10 — End: 2020-08-10
  Administered 2020-08-10: 1000 mL via INTRAVENOUS

## 2020-08-10 MED ORDER — ACETAMINOPHEN 500 MG PO TABS *I*
1000.0000 mg | ORAL_TABLET | Freq: Once | ORAL | Status: AC
Start: 2020-08-10 — End: 2020-08-10
  Administered 2020-08-10: 1000 mg via ORAL

## 2020-08-10 MED ORDER — SODIUM CHLORIDE 0.9 % FLUSH FOR PUMPS *I*
0.0000 mL/h | INTRAVENOUS | Status: DC | PRN
Start: 2020-08-10 — End: 2020-08-10

## 2020-08-10 MED ORDER — ETOMIDATE 2 MG/ML IV SOLN *I*
10.0000 mg | Freq: Once | INTRAVENOUS | Status: AC
Start: 2020-08-10 — End: 2020-08-10
  Filled 2020-08-10: qty 10

## 2020-08-10 MED ORDER — ETOMIDATE 2 MG/ML IV SOLN *I*
INTRAVENOUS | Status: AC | PRN
Start: 2020-08-10 — End: 2020-08-10
  Administered 2020-08-10: 10 mg via INTRAVENOUS

## 2020-08-10 MED ORDER — ACETAMINOPHEN 500 MG PO TABS *I*
ORAL_TABLET | ORAL | Status: AC
Start: 2020-08-10 — End: 2020-08-10
  Filled 2020-08-10: qty 2

## 2020-08-10 MED ORDER — METOPROLOL TARTRATE 1 MG/ML IV SOLN *I*
5.0000 mg | Freq: Once | INTRAVENOUS | Status: AC
Start: 2020-08-10 — End: 2020-08-10
  Administered 2020-08-10: 5 mg via INTRAVENOUS
  Filled 2020-08-10: qty 5

## 2020-08-10 NOTE — ED Notes (Signed)
Pt found to be in rapid Afib, moderate sedation used for cardioversion with 10 mg IV etomidate. Pt now in NSR. Pt monitored for 30 minutes post sedation, maintaining airway and A/O x4.

## 2020-08-10 NOTE — Discharge Instructions (Addendum)
You were seen in the Emergency Department for rapid irregular heart rhythm (atrial fibrillation).  Your lab results showed normal electrolytes and thyroid function.  We performed a procedure (electrical cardioversion) to return your heart to a normal rhythm.    To do after discharge  - The cardiology clinic will call you to make a follow up appointment  - Take metoprolol 12.5 mg every 12 hours until you are seen by cardiology  - Drink plenty of fluids  - Avoid drinking alcohol  - Avoid large amounts of caffeine or other stimulants    Return the Emergency Department if  - You have more chest pain or trouble breathing  - You have a very fast heart rate  - You feel lightheaded or pass out  - You have any other concerning symptoms or you feel unsafe at home

## 2020-08-10 NOTE — ED Notes (Signed)
Pt being d/c to home. D/c instructions and follow up care reviewed, pt stated understanding. PIV removed. Appropriate clothing in place and pt has access to residence. Pt is being driven home by friend

## 2020-08-10 NOTE — ED Notes (Addendum)
Defib synced at 150j, HR: 157 shocked and HR now 82

## 2020-08-10 NOTE — ED Provider Notes (Signed)
History     Chief Complaint   Patient presents with    Chest Pain    Shortness of Breath     34 year old man with history of obesity, hyperlipidemia presenting to the emergency department with substernal chest pressure, palpitations, dyspnea, and lightheadedness which woke him from sleep around 730 this morning.  Patient reports a longstanding history of intermittent chest pain, has been seen multiple times in local emergency departments for this with negative work-ups.  However, he states the symptoms are distinct from his chronic recurrent chest pains and states he has never had this sensation before.  He denies any other recent illnesses, was feeling in his usual state of health when he went to bed last night.  He denies recreational drug use, does occasionally drink alcohol and had about 3 drinks last night.  EKG at triage shows A. fib with RVR.  He denies any history of A. fib or other arrhythmia, no known cardiac history.      History provided by:  Patient and medical records  Language interpreter used: No        Medical/Surgical/Family History     History reviewed. No pertinent past medical history.     Patient Active Problem List   Diagnosis Code    Other chest pain R07.89    Obesity, unspecified E66.9    Hyperlipidemia, unspecified E78.5    GERD (gastroesophageal reflux disease) K21.9    Prediabetes R73.03            Past Surgical History:   Procedure Laterality Date    ORIF right tibia Right      Family History   Problem Relation Age of Onset    Heart Disease Mother     High Blood Pressure Mother     Substance abuse Mother     No Known Problems Sister     No Known Problems Brother     Substance abuse Maternal Grandmother           Social History     Tobacco Use    Smoking status: Current Every Day Smoker     Packs/day: 0.50     Types: Cigarettes    Smokeless tobacco: Never Used   Substance Use Topics    Alcohol use: Yes     Comment: 1-2 times month    Drug use: No     Comment:  previous use x 3 weeks     Living Situation     Questions Responses    Patient lives with Spouse    Homeless No    Caregiver for other family member     External Services     Employment Disabled    Domestic Violence Risk                 Review of Systems   Review of Systems   Constitutional: Negative for fever.   HENT: Negative for rhinorrhea and sore throat.    Respiratory: Positive for shortness of breath.    Cardiovascular: Positive for chest pain and palpitations.   Gastrointestinal: Negative for abdominal pain and nausea.   Musculoskeletal: Negative for back pain and neck pain.   Neurological: Positive for light-headedness. Negative for weakness, numbness and headaches.   Psychiatric/Behavioral: Negative for agitation and confusion.       Physical Exam     Triage Vitals  Triage Start: Start, (08/10/20 5400)   First Recorded BP: 111/78, Resp: 20, Temp: 36 C (96.8 F), Temp src: TEMPORAL Oxygen Therapy  SpO2: 98 %, Oximetry Source: Rt Hand, ETCO2 (mmHg): 39 mmHg, O2 Device: None (Room air), O2 Therapy: Non-Rebreather mask, O2 Flow Rate: 15 L/min, Heart Rate: 86, (08/10/20 0830) Heart Rate (via Pulse Ox): 70, (08/10/20 1026).  First Pain Reported  0-10 Scale: 10, (08/10/20 0830)       Physical Exam  Vitals and nursing note reviewed.   Constitutional:       General: He is not in acute distress.  Cardiovascular:      Rate and Rhythm: Tachycardia present. Rhythm irregular.      Pulses:           Radial pulses are 1+ on the right side and 1+ on the left side.      Heart sounds: No murmur heard.      Pulmonary:      Effort: Pulmonary effort is normal. No respiratory distress.      Breath sounds: Normal breath sounds.   Abdominal:      Palpations: Abdomen is soft.      Tenderness: There is no abdominal tenderness.   Musculoskeletal:      Cervical back: Neck supple.      Right lower leg: No edema.      Left lower leg: No edema.   Skin:     General: Skin is warm and dry.      Capillary Refill: Capillary refill takes less  than 2 seconds.   Neurological:      Mental Status: He is alert and oriented to person, place, and time.   Psychiatric:         Behavior: Behavior normal.         Medical Decision Making   Patient seen by me on:  08/10/2020    Assessment:    34 year old male presenting to the emergency department with acute onset of chest pain, palpitations, dyspnea, and lightheadedness earlier this morning, EKG showing A. fib with RVR.  This appears to be a new diagnosis for him, and has not been noted during his previous ED visit.  Possibly precipitated by recent EtOH use, will check labs to evaluate for significant electrolyte/metabolic abnormalities and hyperthyroidism.  Despite very high ventricular rate, he is generally well-appearing and hemodynamically stable.  No clinical signs of decompensated heart failure.    Plan:    CBC, BMP, magnesium, TSH  IV fluid bolus  IV metoprolol    ED Course and Disposition:    Lab results unremarkable.  HR down to 140s after fluids and metoprolol, still symptomatic.  Given recent onset of symptoms and low CHA2DS2-VASc score, patient is appropriate for cardioversion.  I discussed this with patient, including risks of sedation (impaired respiration, hypoxia, need for assisted ventilation, brain injury, organ injury) and risks of electrical cardioversion (burns, malignant arrhythmia, cardiac arrest, unsuccessful cardioversion).  Discussed alternative treatment including more aggressive rate control.  Patient preferred to pursue electrical cardioversion.  He was successfully cardioverted under procedural sedation and recovered well, remained stable in normal sinus rhythm afterward.  Discharged home with low-dose p.o. metoprolol and referral to cardiology for outpatient follow-up.            Glendale Chard, MD          Glendale Chard, MD  08/10/20 [phone]

## 2020-08-10 NOTE — ED Procedure Documentation (Addendum)
Procedures   Electrical cardioversion  Performed by: Bishop Dublin, MD  Authorized by: Glendale Chard, MD     Consent:     Consent obtained:  Verbal    Consent given by:  Patient    Risks discussed:  Cutaneous burn, death and induced arrhythmia    Alternatives discussed:  No treatment and rate-control medication  Universal protocol:     Patient identity confirmed:  Verbally with patient, arm band and provided demographic data  Pre-procedure details:     Cardioversion basis:  Emergent    Rhythm:  Atrial fibrillation    Electrode placement:  Anterior-posterior  Attempt one:     Cardioversion mode:  Synchronous    Shock (Joules):  150    Shock outcome:  Conversion to normal sinus rhythm  Post-procedure details:     Patient status:  Awake    Post-procedure rhythm:  Normal sinus rhythm    Complications: no complications      Patient tolerance of procedure:  Tolerated well, no immediate complications        Bishop Dublin, MD     Bishop Dublin, MD  Resident  08/10/20 2126    I was present and participated during the entire procedure.    Glendale Chard, MD         Glendale Chard, MD  08/11/20 [phone]

## 2020-08-10 NOTE — ED Provider Progress Notes (Signed)
ED Provider Progress Note    Procedure: Emergency Medicine Training Ultrasound    Procedure performed by Elonda Husky, DO     Date: 08/10/2020   Time: 1100     Type  After consent was obtained from the patient, non-diagnostic, training ED ultrasound examinations: aorta, biliary, urinary tract, trauma, cardiac and thoracic were performed       Elonda Husky, DO, 08/10/2020, 12:00 PM     Marisue Ivan, DO  08/10/20 1200

## 2020-08-10 NOTE — ED Triage Notes (Signed)
C/o sharp midsternal chest pain, palpitations, SOB, and dizziness starting at 730AM when waking. Denies n/v/d. EKG in triage.       Triage Note   Trilby Leaver, RN

## 2020-08-10 NOTE — ED Procedure Documentation (Addendum)
Procedures   Procedural sedation  Performed by: Bishop Dublin, MD  Authorized by: Glendale Chard, MD     Consent:     Consent obtained:  Verbal    Consent given by:  Patient    Risks discussed:  Dysrhythmia, allergic reaction, inadequate sedation, nausea, prolonged hypoxia resulting in organ damage, prolonged sedation necessitating reversal, respiratory compromise necessitating ventilatory assistance and intubation and vomiting  Universal protocol:     Patient identity confirmation method:  Verbally with patient, arm band and provided demographic data  Indications:       Procedure performed:  Cardioversion    Procedural sedation needed because patient is unable to tolerate procedure with local anesthesia alone, due to movement interfering with a successful and safe completion of the procedure    Procedure necessitating sedation performed by:  Different physician    Intended level of sedation:  Moderate (conscious sedation)  Pre-sedation assessment:     NPO status caution: unable to specify NPO status      Neck mobility: normal      Mallampati score:  II - soft palate, uvula, fauces visible    Pre-sedation assessments completed and reviewed: airway patency, cardiovascular function, hydration status, mental status, nausea/vomiting and pain level      History of difficult intubation: no    Immediate pre-procedure details:     Reviewed: vital signs and relevant labs/tests      Verified: bag valve mask available, emergency equipment available, intubation equipment available, IV patency confirmed and oxygen available    Procedure details (see MAR for exact dosages):     Preoxygenation:  Nonrebreather mask    Sedation:  Etomidate    Intra-procedure monitoring:  Blood pressure monitoring, cardiac monitor, continuous capnometry, continuous pulse oximetry, frequent LOC assessments and frequent vital sign checks    Intra-procedure events: none      Moderate:  10-22 minutes  Post-procedure details:     Recovery:  Patient returned to pre-procedure baseline      Complications:  None    Patient is stable for discharge or admission: yes      Patient tolerance:  Tolerated well, no immediate complications      I was present and participated during the entire procedure.    Glendale Chard, MD      Bishop Dublin, MD     Bishop Dublin, MD  Resident  08/10/20 2125       Glendale Chard, MD  08/11/20 [phone]

## 2020-08-10 NOTE — Bed Hold Note (Signed)
Bed: CC-62L  Expected date:   Expected time:   Means of arrival:   Comments:

## 2020-08-12 ENCOUNTER — Telehealth: Payer: Self-pay | Admitting: Cardiology

## 2020-08-12 NOTE — Telephone Encounter (Signed)
8 or 10 am Wednesday. Thanks. NV.

## 2020-08-12 NOTE — Telephone Encounter (Signed)
When do you want to see?  APP?

## 2020-08-12 NOTE — Telephone Encounter (Signed)
Patient was seen in Emergency over the weekend. Needs a follow up with Dr Lyndel Pleasure.

## 2020-08-13 NOTE — Telephone Encounter (Signed)
Spoke w/pt, appt 08/14/20.

## 2020-08-14 ENCOUNTER — Telehealth: Payer: Self-pay | Admitting: Cardiology

## 2020-08-14 ENCOUNTER — Encounter: Payer: Self-pay | Admitting: Cardiology

## 2020-08-14 ENCOUNTER — Encounter: Payer: Self-pay | Admitting: Gastroenterology

## 2020-08-14 ENCOUNTER — Telehealth: Payer: Self-pay | Admitting: Sleep Medicine

## 2020-08-14 ENCOUNTER — Ambulatory Visit: Payer: Medicaid Other | Admitting: Cardiology

## 2020-08-14 VITALS — BP 138/84 | HR 89 | Ht 70.0 in | Wt 301.0 lb

## 2020-08-14 DIAGNOSIS — R0683 Snoring: Secondary | ICD-10-CM

## 2020-08-14 DIAGNOSIS — I4891 Unspecified atrial fibrillation: Secondary | ICD-10-CM

## 2020-08-14 LAB — EKG 12-LEAD
P: 37 deg
PR: 162 ms
QRS: 35 deg
QRSD: 87 ms
QT: 368 ms
QTc: 419 ms
Rate: 78 {beats}/min
T: 45 deg

## 2020-08-14 MED ORDER — METOPROLOL TARTRATE 25 MG PO TABS *I*
25.0000 mg | ORAL_TABLET | Freq: Two times a day (BID) | ORAL | 5 refills | Status: DC | PRN
Start: 2020-08-14 — End: 2022-11-09

## 2020-08-14 NOTE — Telephone Encounter (Signed)
CPT O1607  [employer]-NO COVERAGE FOR HOME SLEEP TEST    Sleep Consult order requested from provider.

## 2020-08-14 NOTE — Telephone Encounter (Signed)
James Beets, MD  James Price  Referral placed. Thank you, NV.

## 2020-08-14 NOTE — Progress Notes (Signed)
Cardiology Follow-Up Visit    Name: James Price PCP: Egbert Garibaldi, MD   DOB: 06-Jan-1986 Date of Visit: 08/14/2020     History of Present Illness   Reason for Visit: ED follow-up-atrial fibrillation    [name] is a 34 y.o. man with morbid obesity, borderline diabetes, smoker, and recently diagnosed atrial fibrillation    The patient was evaluated in the ED on 08/10/2020. He presented with chest discomfort, palpitations, and dyspnea that awoke him from sleep early in the morning. The symptoms were distinct from his prior chest pain episodes. Notes 2-3 drinks of alcohol the night prior.  He was offered cardioversion which was successful in restoring sinus rhythm.  He was discharged on metoprolol tartrate 12.5 mg twice daily.  He was not discharged with anticoagulation given a CHA2DS2-VASc score of 0.    Since discharge he has not experienced recurrence of palpitations.  He notes that the symptoms he experienced with atrial fibrillation were not similar to his prior chest pain symptoms as described in prior notes.  Notes that he drinks alcohol occasionally, maybe once a week and when he does drink he tends to have two or three drinks at a time.  He is known to snore.  His blood pressure is modestly elevated today, but readings have for the most part been normotensive.    Past Medical, Surgical, Family and Social History     Past Medical History:   Diagnosis Date    Obesity     Paroxysmal atrial fibrillation      Past Surgical History:   Procedure Laterality Date    ORIF right tibia Right      Family History   Problem Relation Age of Onset    Heart Disease Mother     High Blood Pressure Mother     Substance abuse Mother     No Known Problems Sister     No Known Problems Brother     Substance abuse Maternal Grandmother      Social History     Socioeconomic History    Marital status: Single     Spouse name: Not on file    Number of children: 3    Years of education: Not on file     Highest education level: Not on file   Tobacco Use    Smoking status: Current Every Day Smoker     Packs/day: 0.50     Types: Cigarettes    Smokeless tobacco: Never Used   Substance and Sexual Activity    Alcohol use: Yes     Comment: 1-2 times month    Drug use: No     Comment: previous use x 3 weeks    Sexual activity: Yes     Partners: Female   Other Topics Concern    Not on file   Social History Narrative    Lives with Dad       Review of Systems   A 10+ system review was conducted and was negative except as above    Medications and Allergies     Patient's Medications   New Prescriptions    No medications on file   Previous Medications    ASPIRIN 325 MG TABLET    Take 325 mg by mouth daily    IBUPROFEN (ADVIL,MOTRIN) 600 MG TABLET    Take 1 tablet (600 mg total) by mouth 3 times daily as needed for Pain    NICOTINE POLACRILEX (NICORETTE) 4 MG GUM  for Nicotine Addiction Max dose 24 pieces/24 hours; avoid food/drink 15 mins before or after use    OMEPRAZOLE (PRILOSEC) 20 MG CAPSULE    Take 1 capsule (20 mg total) by mouth daily (before breakfast)   Modified Medications    Modified Medication Previous Medication    METOPROLOL TARTRATE (LOPRESSOR) 25 MG TABLET metoprolol tartrate (LOPRESSOR) 25 MG tablet       Take 1 tablet (25 mg total) by mouth 2 times daily as needed (Palpitations, atrial fibrillation)    Take 0.5 tablets (12.5 mg total) by mouth 2 times daily   Discontinued Medications    No medications on file     Allergies   Allergen Reactions    Shellfish-Derived Products Itching       Vitals and Physical Exam     VS:  BP 138/84    Pulse 89    Ht 1.778 m (5\' 10" )    Wt 136.5 kg (301 lb)    SpO2 96%    BMI 43.19 kg/m    Gen:  Appears comfortable  HENT:  NC/AT, neck supple, JVP not distended  Resp:  CTA  CV:  RRR, . No murmurs, rubs, or gallops. No lower extremity edema  GI:  Abdomen is soft, non-tender, non-distended  Msk:  No cyanosis, no clubbing, no joint tenderness or erythema  Skin:  No  rashes    Laboratory Data     Lab Results   Component Value Date    CREAT 0.88 08/10/2020    UN 9 08/10/2020    NA 140 08/10/2020    K 4.2 08/10/2020    CL 104 08/10/2020    CO2 25 08/10/2020         Lab results: 07/12/20  1031   Cholesterol 191   HDL 34*   LDL Calculated 132*   Triglycerides 124   Non HDL Cholesterol 157   Chol/HDL Ratio 5.6     Lab Results   Component Value Date    HA1C 6.1 (H) 07/12/2020       Cardiac/Imaging Data     ECG: NSR, nonspecific T wave abnormalities  ED ECGs: Atrial fibrillation, ventricular rate 129-140 bpm, minor T wave flattening         EXERCISE STRESS ECHO COMPLETE 08/05/2020    Interpretation Summary  1.  Normal LV size, mass, and EF without regional wall motion abnormalities  2.  No significant valvular abnormalities  3.  Normal right heart size and function with estimated normal pulmonary artery systolic pressure  4.  No ischemic ECG changes or arrhythmias with exercise  5.  Negative exercise stress echo for myocardial ischemia at the cardiac workload achieved              Orders Placed This Encounter   Procedures    AMB REFERRAL TO SLEEP MEDICINE    EKG 12 lead    Sleep Study       Impression & Plan     34 y.o. man with morbid obesity, borderline diabetes, smoker, and recently diagnosed atrial fibrillation    The patient has a new diagnosis of atrial fibrillation s/p cardioversion and has since maintained sinus rhythm.  We discussed contributors to developing atrial fibrillation including obesity, hypertension, alcohol use, and OSA.  He does report snoring and I have referred him to sleep medicine for consultation.  I reviewed that alcohol consumption even in low or moderate amounts increases the risk of atrial fibrillation in a dose-dependent fashion.  Weight loss  through improving diet and increasing aerobic exercise will help to avoid future episodes.    I asked him to discontinue the twice daily metoprolol.  I provided him with a prescription for metoprolol 25 mg which  he can take as needed if he should develop recurrent atrial fibrillation.  I advised that he can take 25 mg followed by 50 mg for heart rate control.  If his symptoms and heart rate are controlled he can await spontaneous conversion to sinus rhythm and he can also call our office for evaluation.  His CHA2DS2-VASc score is 0 and anticoagulation is not indicated at this time.    I will plan on following up in 6 months.  Thank you for allowing me to participate in the care of your patient.       [name], MD  Cardiovascular Disease  [facility]

## 2020-08-14 NOTE — Telephone Encounter (Signed)
Spoke to Fossil at [phone] and scheduled sleep consult 09-26-20 at 8:30 at [address]  Called and spoke to patient and gave him appt information as above, and it is in his mychart. Done.

## 2020-08-14 NOTE — Telephone Encounter (Signed)
Copied from CRM #[id number]. Topic: Appointments - Schedule Appointment  >> Aug 14, 2020 12:17 PM Diana Eves wrote:  Tyler Aas w/[facility] telephoned regarding Patient James Price to schedule NPV p/Referral from Blaine Hamper for Snoring (Artrial Fibrillation)    NPV scheduled w/M. Lacie Draft on November 11th @ 8:30 am    If need be, Doris w/[facility] can be reached at Phone #[phone]

## 2020-08-14 NOTE — Telephone Encounter (Signed)
Please contact patient and schedule Sleep Consult

## 2020-08-15 ENCOUNTER — Ambulatory Visit: Payer: Medicaid Other | Admitting: Family Medicine

## 2020-08-15 ENCOUNTER — Encounter: Payer: Self-pay | Admitting: Family Medicine

## 2020-08-15 VITALS — BP 128/78 | HR 83 | Temp 98.1°F | Ht 70.0 in | Wt 298.0 lb

## 2020-08-15 DIAGNOSIS — I480 Paroxysmal atrial fibrillation: Secondary | ICD-10-CM | POA: Insufficient documentation

## 2020-08-15 DIAGNOSIS — E669 Obesity, unspecified: Secondary | ICD-10-CM

## 2020-08-15 DIAGNOSIS — F17200 Nicotine dependence, unspecified, uncomplicated: Secondary | ICD-10-CM

## 2020-08-15 LAB — EKG 12-LEAD
P: 16 deg
PR: 166 ms
QRS: 45 deg
QRSD: 84 ms
QT: 348 ms
QTc: 392 ms
Rate: 76 {beats}/min
T: 78 deg

## 2020-08-15 MED ORDER — NICOTINE POLACRILEX 4 MG MT GUM *I*
CHEWING_GUM | OROMUCOSAL | 0 refills | Status: DC
Start: 2020-08-15 — End: 2021-01-07

## 2020-08-15 NOTE — Progress Notes (Signed)
Subjective    History:    James Price is a 34 y.o. male who presents for Chest Pain (dyspnea, chest pain, and light headedness has improved, ED follow-up)      HPI     Atrial Fib  New diagnosis. Seen in ED on 9/25 for a. Fib with RVR. Cardioverted, which was successful in restoring sinus rhythm. Discharged home on 12.5 mg of Metoprolol. He was not discharged with anticoagulation given CHA2DS2-VASc score of 0.    Seen by cardiology yesterday, referred to sleep medicine for OSA evaluation. Advised to stop metoprolol BID and start 25 mg of metoprolol PRN for recurrent a. Fib.     Continues to feel well without CP, palpitations, dizziness or SOB.    Nicotine dependence  Down from 1 pack to 1/2 pack day.  Did not get gum from pharmacy, plans to pick up today.  Goal: Smoking cessation by next appointment    Obesity  Working on weight loss. Cut back on soda. Plans to be down 3 more lbs by next visit.          Objective   Examination:  Vitals:    08/15/20 1448   BP: 128/78   BP Location: Right arm   Patient Position: Sitting   Cuff Size: large adult   Pulse: 83   Temp: 36.7 C (98.1 F)   TempSrc: Temporal   SpO2: 98%   Weight: 135.2 kg (298 lb)   Height: 1.778 m (5\' 10" )     Body mass index is 42.76 kg/m.    Wt Readings from Last 3 Encounters:   08/15/20 135.2 kg (298 lb)   08/14/20 136.5 kg (301 lb)   08/10/20 133.8 kg (295 lb)     BP Readings from Last 3 Encounters:   08/15/20 128/78   08/14/20 138/84   08/10/20 112/83     Physical Exam    General: Pleasant and cooperative, clean, appropriately dressed, A&Ox3. Initiated conversation, actively engaged in encounter. NAD.  Neuro: Alert and oriented  Pulmonary: Effort normal. No respiratory distress.  Psychiatric: Normal mood and affect. Behavior is normal. Thought content normal.         Assessment         Assessment/Plan:    1. Paroxysmal atrial fibrillation  New problem, reviewed diagnosis and treatment with patient. Answered all questions.   Has appointment with  sleep medicine scheduled for November.    2. Nicotine dependence, unspecified, uncomplicated  - Has cut back from 1 pack to 1/2 pack/day since last appointment. Goal is smoking cessation by next appointment.  - More than three minutes of smoking cessation counseling done.  Patient co-morbidities include None    Risks of smoking reviewed including possible lung disease (COPD, cancer) and cardiac disease.  Options of cessation aides reviewed including nicotine patch, hypnosis, and possible prescription medications.  Patient encouraged to follow-up with outpatient physician.    - nicotine polacrilex (NICORETTE) 4 MG gum; for Nicotine Addiction Max dose 24 pieces/24 hours; avoid food/drink 15 mins before or after use  Dispense: 100 each; Refill: 0    3. Obesity, unspecified classification, unspecified obesity type, unspecified whether serious comorbidity present  Established problem, not at goal  - Discussion options for healthier eating and increased activity to help with weight loss.  - Advised at least 30 - 45  minutes of moderate exercise per day at least 5 days per week.   - Recommended healthy foods including lots of fruits, vegetables, and whole grains. I  recommended eating  smaller meals 3/day, avoid skipping meals and snacking between meals. I discouraged  drinking  soda,  sweetened drinks, and juice.  I discouraged eating fried and processed foods.  - Goal: 3lbs by next appointment.    Return for scheduled follow-up.               Georgeanne Nim, NP   [facility]  [address] [place], [address]  [place] [address]  Dept: [phone]  Dept Fax: [phone]

## 2020-09-05 ENCOUNTER — Ambulatory Visit: Payer: Medicaid Other | Admitting: Family Medicine

## 2020-09-05 ENCOUNTER — Encounter: Payer: Self-pay | Admitting: Family Medicine

## 2020-09-26 ENCOUNTER — Encounter: Payer: Self-pay | Admitting: Sleep Medicine

## 2020-09-26 ENCOUNTER — Ambulatory Visit: Payer: Medicaid Other | Attending: Sleep Medicine | Admitting: Sleep Medicine

## 2020-09-26 VITALS — BP 139/79 | HR 89 | Temp 97.0°F | Ht 70.0 in | Wt 296.3 lb

## 2020-09-26 DIAGNOSIS — F5112 Insufficient sleep syndrome: Secondary | ICD-10-CM

## 2020-09-26 DIAGNOSIS — G4733 Obstructive sleep apnea (adult) (pediatric): Secondary | ICD-10-CM | POA: Insufficient documentation

## 2020-09-26 NOTE — Patient Instructions (Signed)
Obstructive Sleep Apnea    Obstructive sleep apnea (OSA) is a very common condition affecting 4-8% of American adults.  It is caused by the back of the throat blocking off during sleep leading either to a complete stoppage of breathing or a reduction in airflow.  These breathing disturbances then lead to a poor quality sleep although the person with OSA may have no idea that these events are occuring.  Almost everyone with OSA snores, although just because a person snore does not mean that they have OSA.    Since OSA can lead to a poor quality sleep, one of the main symptoms is excessive sleepiness.  It can also negatively impact memory, concentration, and mood just like a lack of sleep can.  In addition to these effects on how a person feels, it may also affect your health.  It can contribute to high blood pressure, Diabetes, and high cholesterol. It  also increases risk for heart disease, irregular heart beat, and stroke.    One of the biggest dangers of OSA, or any condition that makes a person tired is drowsiness with driving.  A drowsy driver is impaired to the same degree as a person that is legally drunk.  People with sleep apnea may have a 2-4 fold increased risk for a car accident.  If you are driving and start feeling sleepy, you need to pull over and take a break (perhaps even drink a cafeinated beverage if necessary).  Measures like rolling down the window or turning up the radio are not effective and will not help you to arrive at your destination safely.    There are a number of factors that can increase the risk for sleep apnea.  Obesity is a common factor, therefore if a person is overweight and has OSA then weight loss can sometimes be curative.  Other factors include family history, enlarged tonsils, nose and jaw structure, as well as alcohol and cigarette use.

## 2020-09-26 NOTE — Progress Notes (Signed)
[facility]   Dr. Valetta Fuller , M.D.    James Price  [id number]    Dear [name], MD ,    James [facility] comes to the [facility] today in consultation for his complaint of "my sleep is not good".    As you know, patient is a 34 year old gentleman with recent paroxysmal atrial fibrillation, status post cardioversion who was referred by his cardiologist for evaluation sleep apnea.    He does snore, sometimes loudly.  He is not been told that he stops breathing in his sleep.  He tells me he does not have a regular bed partner, so some of his sleep history is unknown to him.  He does not wake up gasping or choking.  Tends to sleep in the prone position.  Sometimes he will wake with a dry mouth.  He thinks he might be a mouth breather because he has year-round nasal congestion which he tries to control with over-the-counter antihistamines.    There is minimal daytime drowsiness.  He tells me he can feel somewhat sleepy watching television.  He will nap occasionally for 1 or 2 hours.  He can feel drowsy riding in an automobile, but not driving.  His Epworth Sleepiness Scale score today is 5.    Patient goes to bed quite late, but also gets up early for work.  He is a longstanding "night person".  Typically goes to bed between 2 or 3 AM.  Takes no hypnotics.  Falls asleep pretty quickly.  Gets up to start his day around 6 AM, not usually feeling rested.  If he has a day off, he will go back to sleep and start his day closer to 10 AM.    He does move in his sleep.  He attributes this at least partially to some back pain.    Drinks 2 to 3 cups of caffeinated coffee a day.    He has been reducing his alcohol intake in the setting of recent atrial fibrillation.  He tells me he will sometimes drink up to 4 drinks on a weekend mill.    His weight has been stable.  He hopes to lose weight.  He also hopes to stop smoking cigarettes.    No history of depression or anxiety.        Recent Review  Flowsheet Data     Epworth Sleep Scale 09/26/2020    Sitting and reading 0    Watching TV 1    Sitting inactive in a public place (e.g a theater or a meeting) 0    As a passenger in a car for an hour without a break 3    Lying down to rest in the afternoon when circumstances permit 1    Sitting and talking to someone 0    Sitting quietly after a lunch without alcohol 0    In a car, while stopped for a few minutes in traffic 0    EPWORTH TOTAL  5                   ACTIVE PROBLEM LIST:     Patient Active Problem List   Diagnosis Code    Other chest pain R07.89    Obesity, unspecified E66.9    Hyperlipidemia, unspecified E78.5    GERD (gastroesophageal reflux disease) K21.9    Prediabetes R73.03    Paroxysmal atrial fibrillation I48.0       REVIEW OF SYSTEMS:  Negative-hypertension, lungs, liver, kidneys, thyroid, anemia, stroke, integument  Positive-cholesterol, GERD, paroxysmal atrial fibrillation    MEDICAL HISTORY:     Past Medical History:   Diagnosis Date    Obesity     Paroxysmal atrial fibrillation          MEDICATIONS:     Current Outpatient Medications   Medication Sig    nicotine polacrilex (NICORETTE) 4 MG gum for Nicotine Addiction Max dose 24 pieces/24 hours; avoid food/drink 15 mins before or after use    metoprolol tartrate (LOPRESSOR) 25 MG tablet Take 1 tablet (25 mg total) by mouth 2 times daily as needed (Palpitations, atrial fibrillation)    omeprazole (PRILOSEC) 20 MG capsule Take 1 capsule (20 mg total) by mouth daily (before breakfast)    ibuprofen (ADVIL,MOTRIN) 600 MG tablet Take 1 tablet (600 mg total) by mouth 3 times daily as needed for Pain    aspirin 325 MG tablet Take 325 mg by mouth daily (Patient not taking: Reported on 08/14/2020)     No current facility-administered medications for this visit.       ALLERGIES:     Allergies   Allergen Reactions    Shellfish-Derived Products Itching       SOCIAL HISTORY:     The [place] lives in [place].  He is single.  He does have  children.  He works in [employer], water quality, starting pretty early in the morning.  Smokes half a pack of cigarettes a day and hopes to quit.  Alcohol as above.    FAMILY HISTORY:       Sleep disorders in the family    PHYSICAL EXAM:     Neck Circumference: 18.3 inches   Vitals:    09/26/20 0851   BP: 139/79   Pulse: 89   Temp: 36.1 C (97 F)   Weight: 134.4 kg (296 lb 4.8 oz)   Height: 1.778 m (5\' 10" )     Pain    09/26/20 0851   PainSc:   0 - No pain       Gen: NAD, pleasant and interactive  Car:  RRR, nl S1/S2  Lungs:  CTA bilaterally  Neurologic:  Pupils 4-61mm bilaterally  HEENT:   Nasal:  Narrow  Soft palate:  Low lying, floppy palate  Hard palate:  OK  Mallampati:  3  Oral Cavity:  Irritated, long  Tonsils:  absent  Tongue:  large  Bite:  maloccluded  Molars:  Many missing     Extremities:  No LE edema    IMPRESSION:     34 year old gentleman with recently diagnosed paroxysmal atrial fibrillation status post cardioversion comes for evaluation    1. Sleep disordered breathing.  He sleeps alone, so much of his sleep history is unknown to him.  There is loud snoring.  Some daytime drowsiness.  On examination, his BMI is 42.5, and he is crowded pharyngeal anatomy.  I suspect obstructive sleep apnea.  He needs to be seen if this is having an impact on his cardiac concerns.  2. Insufficient sleep.  The patient has delayed sleep phase, but needs to get up early for work.  His leaves him with about 4 hours of sleep a night during the workweek.    PLAN:     1. I discussed sleep apnea, its causes, consequences, and treatments.  2. I have ordered a home apnea screen to evaluate for the possibility of obstructive sleep apnea.  3. I encouraged his efforts at weight loss and  tobacco cessation.  I encouraged efforts at alcohol moderation.  I recommended he get more sleep at night.  4. I will email the patient with the results of sleep testing when available.  He did select a DME company today.        Thank you for allowing me to  participate in this patient's care.  Please do not hesitate to contact me with questions or concerns.  I personally spent 47 minutes on the calendar day of the encounter, including pre and post visit work.  The patient had no further questions at the end of our visit.        Fredrik Cove, MD    *This note was dictated using Dragon Medical One, and reasonable efforts were made to correct for any dictation errors.

## 2020-09-28 IMAGING — DX DG CHEST 1V PORT
1 series · 1 of 1 positions shown · non-contrast
Comparison: None.

CLINICAL DATA: Chest pain, lightheaded, dizziness X 1 week, dry
cough X 2 days. Pain is intermittently stabbing or pressure in the
middle of his chest radiating under the left arm and down the left
arm. Denies fever and chills. No known heart or lung conditions. Pt
is smoker.COVID test pending.

EXAM:
PORTABLE CHEST 1 VIEW

[chest ap]
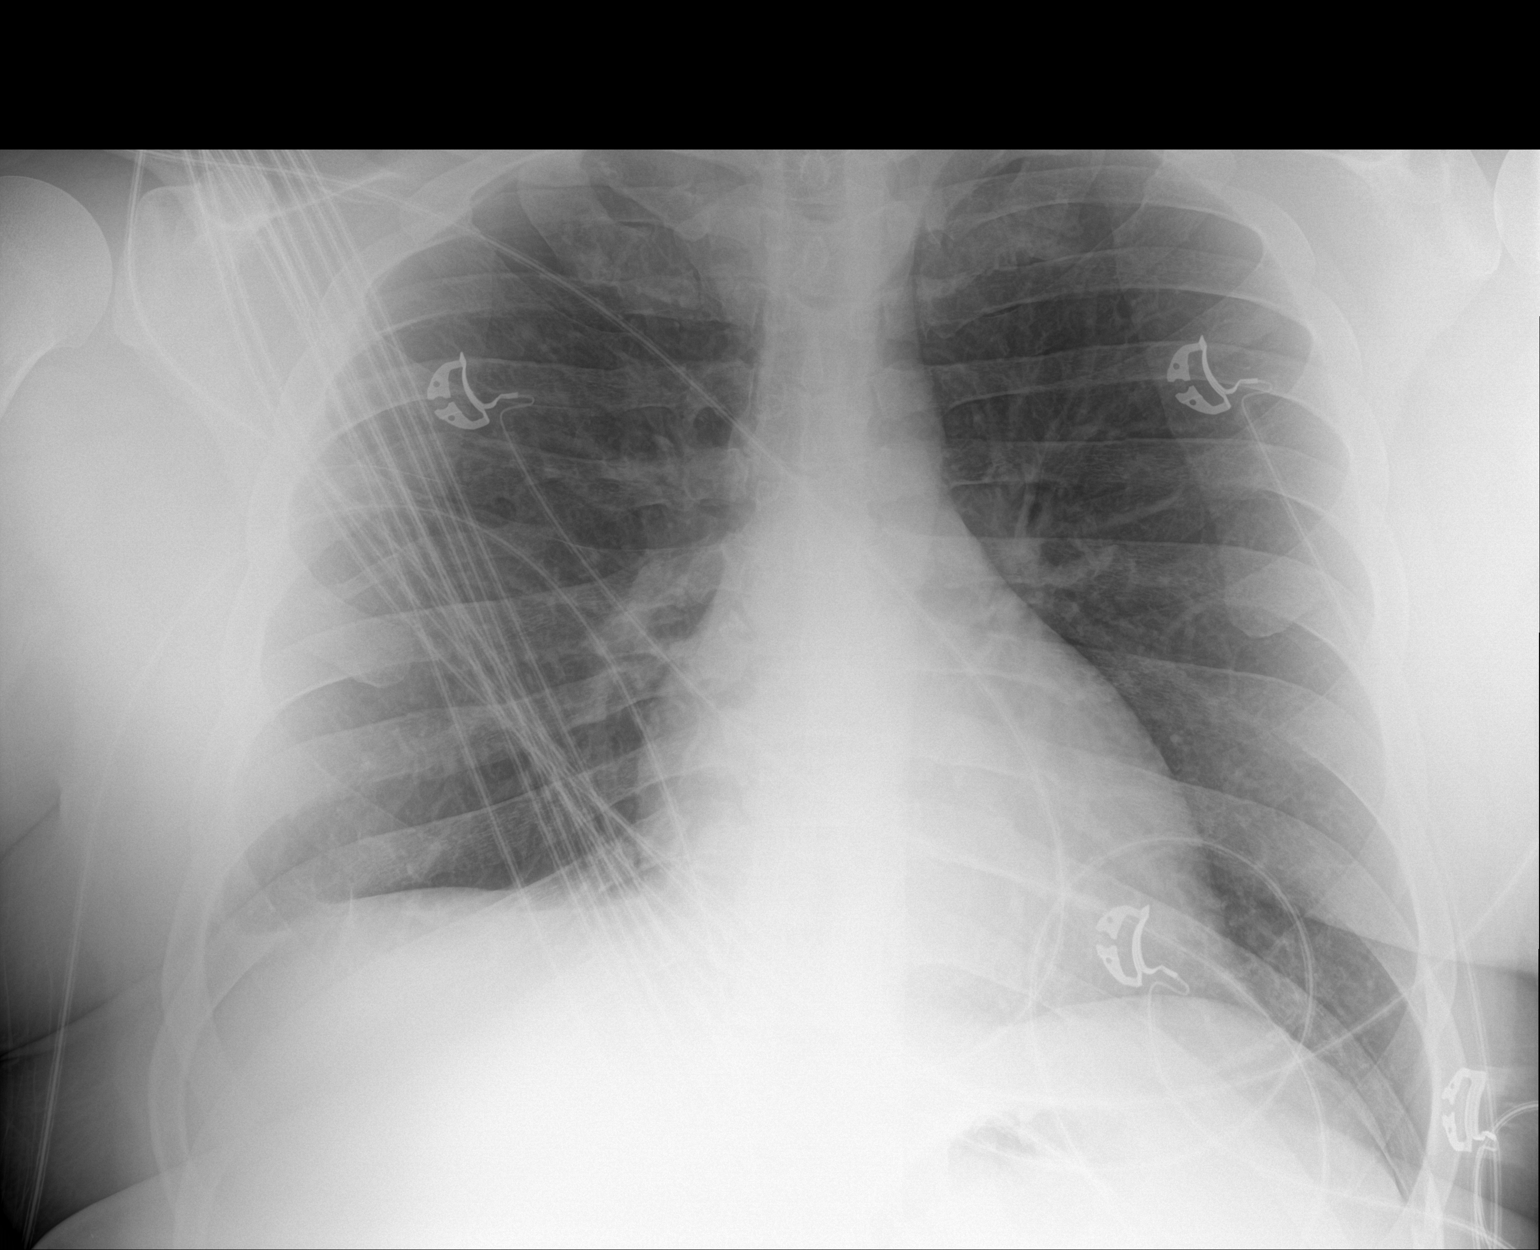

[1 of 1 positions shown; findings below may reference images not displayed]

FINDINGS: Normal heart, mediastinum and hila.

Clear lungs.  No pleural effusion or pneumothorax.

Skeletal structures are grossly intact.
IMPRESSION: No active disease.

## 2020-10-16 ENCOUNTER — Encounter: Payer: Self-pay | Admitting: Allergy/Immunology/Rheumatology

## 2020-10-16 ENCOUNTER — Ambulatory Visit: Payer: Medicaid Other | Admitting: Allergy/Immunology/Rheumatology

## 2020-10-16 ENCOUNTER — Ambulatory Visit: Payer: Medicaid Other

## 2020-10-16 VITALS — BP 137/72 | HR 91 | Temp 97.3°F | Ht 70.0 in | Wt 293.8 lb

## 2020-10-16 DIAGNOSIS — Z91018 Allergy to other foods: Secondary | ICD-10-CM

## 2020-10-16 DIAGNOSIS — J309 Allergic rhinitis, unspecified: Secondary | ICD-10-CM

## 2020-10-16 DIAGNOSIS — J310 Chronic rhinitis: Secondary | ICD-10-CM

## 2020-10-16 MED ORDER — HYDROCORTISONE ACETATE 1 % EX CREAM (MULTIPLE PATIENTS) *I*
1.0000 | TOPICAL_CREAM | Freq: Once | CUTANEOUS | Status: AC
Start: 2020-10-16 — End: 2020-10-17

## 2020-10-16 MED ORDER — EPINEPHRINE 0.3 MG/0.3ML IJ SOAJ *I*
0.3000 mg | INTRAMUSCULAR | 2 refills | Status: AC | PRN
Start: 2020-10-16 — End: ?

## 2020-10-16 NOTE — Patient Instructions (Addendum)
--   Testing was positive to: shrimp, crab, lobster, clam, oyster. Small positive result to scallop.   -- Do not eat shrimp, crab, lobster, clams, or oysters.   -- You can still eat scallops. Make sure to keep them in your diet on a regular basis to maintain tolerance.    -- Keep 2 EpiPen devices available at all times.     -- Testing to environmental allergens was positive to: cat, dust mites, cockroach, trees, grasses, ragweed, and molds.   -- Try taking cetirizine (Zyrtec) or fexofenadine (Allegra) daily.   -- See dust mite avoidance measures:    Dust mites are microscopic critters that live in mattresses, blankets, stuffed animals, and carpets. The droppings of house dust mites are a common cause of indoor allergies.  The following things will help limit your exposure to dust mites.     If you can, purchase allergy covers for your pillows and mattress.  These should be made of a tightly woven fabric and zip completely around, incasing the bedding.  They should be impermeable (pore size ? 10 ?m).   Wash all bedding in hot water (54.4C, or 130F), and dry on the highest heat setting once per week.    Ideally, if you can, remove the carpet and/or area rug(s) as well as heavy draperies from the bedroom.   - Alternative is benzyl benzoate or tannic acid applied to carpet, but this is only a temporary solution.   If able, remove upholstered furniture or change to vinyl or leather.    Use a vacuum with a HEPA (high-efficiency particulate air) filter or a double-layered bag, and wear a dust mask if needed.   Avoid using room humidifiers. Dust mites thrive in humid environments. You may want to purchase a de-humidifier, or run the air conditioning in the summer months. A hygrometer can be purchased to measure humidity, which should ideally be kept < 50%.       _________________________________________________________________________________________      What you need to know about your EpiPen:     You have an  allergy that puts you at risk for anaphylaxis and an accidental exposure can be life threatening, therefore it is essential for you to carry your epinephrine auto-injector with you at all times.  The things you need to remember about the medication pen are the following:  1. Storage- Epinephrine is light and temperature sensitive.  It should not be refrigerated or stored in extremes of temperatures (do not leave it in the car!)  2. Your epinephrine auto-injector should be renewed every year  3. It is recommended to have access to two autoinjectors at a given time, since a second dose of epinephrine may be necessary.  4. If the epinephrine auto-injector is used, you must call 911

## 2020-10-16 NOTE — Progress Notes (Addendum)
Aeroallergen and foods percutaneous testing completed per order.  Hydrocortisone cream applied for local pruritus.  Pt tolerated well.    Patient instructed on how to properly administer epinephrine injection via EpiPen.   Give epinephrine injection immediately when symptoms of severe reaction begin (difficulty breathing or swallowing, feeling of passing out, etc.)   Grasp unit with orange tip pointing downward   Form a fist around the unit, and with your other hand remove blue safety release   Inject medication into outer thigh or other available large muscle-removal of clothing is not necessary   To inject, hold orange tip near outer thigh, swing and firmly push against your outer thigh at 90 degree angle, until you hear the EpiPen click   Continue to hold against your thigh for 10 seconds to deliver the medication   Get emergency medical attention   Use second EpiPen if no relief within 5 minutes    Patient verbalized understanding with read back. Writer demonstrated injection with EpiPen trainer and patient returned appropriateadministrationdemonstration.

## 2020-10-16 NOTE — Progress Notes (Signed)
[facility]       HOME SLEEP STUDY POST TEST QUESTIONNAIRE      Patient Name:        James Price    MRN #  [id number]      1. What was your approximate bedtime? _________________________      2.  How long did it take you to fall asleep? _________________________      3. How many times did you wake up? _________________________      a. Approximately what time? / How long were you awake?    _____________/_______________    _____________/_______________    _____________/_______________    _____________/_______________    4. At what time did you get up for the day? __________________________    5. Any problems or comments?  __________________________               Patient signature:______________________   Date:_______________            Please Complete this page  &  Return with Sleep Study Case.Marland KitchenMarland KitchenThank You!                                                                                                      [facility]    HOME SLEEP TESTING EQUIPMENT AGREEMENT FORM    PATIENT NAME:  James Price     MRN #:    [id number]           PHONE NUMBER:   [phone]     ADDRESS:   [address]  [place] [address]    I have received a Home Sleep Monitor (Alice NightOne) from the [facility] and have been instructed how to use it.    Serial Number:  [id number]    I have agreed to return this monitor to [address] on  ______________ between 6:00 AM and 1:00 PM.     Anyone can drop-off HSAT device for a patient   To coordinate a later drop-off time please ask your sleep tech Or call the Sleep Tech Team directly @ [phone]   To ensure patient health, please maintain social distancing rules during drop off.     Sleep Lab open until 4:30 pm Monday thru Friday    If you have any questions after initial equipment trial, please call our office at [phone].    This monitor is the property of [facility].  The value of this equipment is $2,495.  If you fail to return it  by the date stated above, I understand that the Hospitals legal authorities will be notified.  Failure to return this monitor is considered theft of hospital property,                                            READ THIS FIRST    Home Sleep Study Guide     For Home Sleep Study set-up instructions watch  the following video: [email or web address]  Click the link sent via a MyChart message or search the Internet for [employer].  Refer to the provided instructions as needed.     To initiate recording simply place the blue belt around your chest making sure both ends of the belt are fully connected to the device.  This belt action automatically starts and stops the apnea screening device. The On/Off quadrant light spins for 30 seconds.  The 3 sensor lights flash yellow until they recognize a good signal changing to solid green.  Once all 3 turn green the On/Off light also turns solid green for 1 minute, then they'll shut off for the night one by one.  The device does not need to be on your skin, can go over top of nightwear.     We suggest that you adequately tape the cannula and oximeter, and sleep in whatever position you prefer.  If you get up during the night you can leave the equipment on, feel free to remove the finger probe briefly while up, just put back on a finger when returning to bed.       In the morning, unplug one side of the belt to stop the study and discard nasal cannula before placing equipment back in the box.  All other equipment can remain attached to the unit.       Return the unit to the lab the next business day between preferred hours of 6:00 am and 1:00 pm.      To report device drop-off please call [phone] upon arrival.  You can place case in the Home Study Drop-Box outside our main entrance Or we can retrieve the device from your vehicle.  Staff is available for drop-off until 4:30 pm or please utilize the 24/7 drop-box as needed.     If  unable to return the device timely please call us @ [phone].     Results will be communicated via telemedicine or My Chart.      If you or someone in your home develop COVID-19 symptoms within 24 hours of the study please call the lab at [phone].                                                       [facility]    HOME SLEEP TESTING EQUIPMENT AGREEMENT FORM    PATIENT NAME:  James Price     MRN #:    [id number]           PHONE NUMBER:   [phone]     ADDRESS:   [address]  [place] [address]    I have received a Home Sleep Monitor (Alice NightOne) from the [facility] and have been instructed how to use it.    Serial Number:  [id number]    I have agreed to return this monitor to [address] on  ______________ between 6:00 AM and 1:00 PM.     Anyone can drop-off HSAT device for a patient   To coordinate a later drop-off time please ask your sleep tech Or call the Sleep Tech Team directly @ [phone]   To ensure patient health, please maintain social distancing rules during drop off.     Sleep Lab open until 4:30 pm Monday  thru Friday    If you have any questions after initial equipment trial, please call our office at [phone].    This monitor is the property of [facility].  The value of this equipment is $2,495.  If you fail to return it by the date stated above, I understand that the Hospitals legal authorities will be notified.  Failure to return this monitor is considered theft of hospital property.      Signed:  ________________________   Date: _________      Witness:  _______________________   Date: _________

## 2020-10-16 NOTE — Progress Notes (Signed)
Allergy New Patient Consult Note    PRIMARY CARE PHYSICIAN:  Rito Ehrlich, MD   REFERRING PHYSICIAN: Zella Richer, NP    CHIEF COMPLAINT: Shellfish allergy    HPI:   Najir Roop is a 34 y.o. male with past medical history significant for paroxysmal atrial fibrillation.  He is referred for the evaluation of an allergy to shellfish.     His reaction to shellfish dates back several years ago. He ate shrimp and then his mouth felt itchy or weird.  The symptoms resolved without treatment.  He doesn't member how long the symptoms lasted, but doesn't think that they lasted for a long time.  There was no associated angioedema or breathing difficulty.  He believed that his symptoms represented an allergy, so he stopped eating shrimp completely.  He has touched him prepared shrimp and has not had any symptoms.  He has also been avoiding crab and lobster.  He continues to eat scallops and tolerates them without symptoms.  He does not eat mussels, oysters, or clams due to personal preference. An EpiPen has never been prescribed.    He also has a history of environmental allergies.  He believes triggers include pollen and dust. Symptoms are present year-round with no seasonal variation. His typical symptoms include a tingling sensation in his nose, frequent sneezing, nasal congestion, sometimes runny nose, itchy eyes, post nasal drip. He takes Claritin, Zyrtec, or Benadryl when symptoms are present. They are only helpful if he catches the symptoms early.     He has no pets at home.       Xyon has no history of asthma.    He has no history of drug allergy.  He has no history of stinging insect allergy.   He does not have a history of chronic urticaria or angioedema.    There have been no recurrent or chronic infections.    There have been no reactions to vaccines.      CURRENT MEDICATIONS:   Current Outpatient Medications   Medication Sig Dispense Refill    nicotine polacrilex (NICORETTE) 4 MG gum for Nicotine  Addiction Max dose 24 pieces/24 hours; avoid food/drink 15 mins before or after use 100 each 0    metoprolol tartrate (LOPRESSOR) 25 MG tablet Take 1 tablet (25 mg total) by mouth 2 times daily as needed (Palpitations, atrial fibrillation) 60 tablet 5    omeprazole (PRILOSEC) 20 MG capsule Take 1 capsule (20 mg total) by mouth daily (before breakfast) 30 capsule 5    ibuprofen (ADVIL,MOTRIN) 600 MG tablet Take 1 tablet (600 mg total) by mouth 3 times daily as needed for Pain 30 tablet 0    aspirin 325 MG tablet Take 325 mg by mouth daily       No current facility-administered medications for this visit.       ALLERGIES:   Allergies   Allergen Reactions    Shellfish-Derived Products Itching       PAST MEDICAL HISTORY:   Past Medical History:   Diagnosis Date    Obesity     Paroxysmal atrial fibrillation        PAST SURGICAL HISTORY:  Past Surgical History:   Procedure Laterality Date    ORIF right tibia Right        FAMILY HISTORY:   Family History   Problem Relation Age of Onset    Heart Disease Mother     High Blood Pressure Mother     Substance abuse Mother  No Known Problems Sister     No Known Problems Brother     Substance abuse Maternal Grandmother        SOCIAL HISTORY:   Garik is a current smoker, but he wants to quit. Has nicotine gum, but hasn't started it yet.  He lives with his step-mother and his dad in a single family home.  There are no pets in the home.  He is working - Education administrator for [employer].    PHYSICAL EXAMINATION:  BP 137/72    Pulse 91    Temp 36.3 C (97.3 F) (Temporal)    Ht 1.778 m (5' 10" )    Wt 133.3 kg (293 lb 12.8 oz)    SpO2 99%    BMI 42.16 kg/m      Constitutional: Alert, appears well, and in no acute distress  Eyes: No conjunctival erythema, no eyelid or periorbital edema  Ears: Canals normal, TM's visible bilaterally and normal  Nose: No significant discharge, nasal mucosa appears normal.  Mild inferior turbinate hypertrophy.  Oropharynx: Normal mucosa, no oral lesions,  posterior oropharynx without edema or erythema  Cardiovascular: Normal S1-S2, no murmur or gallop or rub, regular rate and rhythm.  Respiratory: Clear to auscultation bilaterally, good air movement, breathing comfortably on room air   Skin: No visible rash.  Neurologic: Moving all extremities, mental status normal for age  Psychiatric: Normal affect for age      TESTING:  Food Testing Review Flowsheet 10/16/2020   Type of testing performed: Percutaneous   (+)   Histamine     52m/ml     HS 6/40   (-)   0.9% NaCl/   0.03%Alb/   0.4% Phenol 0/0   Shrimp 11/30   Crab 6/25   Lobster 6/30   Clam 5/20   Scallop 3/10   Oyster 5/14   Additional Food #1 Fresh Shrimp 8/25       Allergy Testing 10/16/2020   Type of testing performed: Percutaneous   (+)   Histamine     124mml     HS 6/40   (-)   0.9% NaCl/   0.03%Alb/   0.4% Phenol 0/0   Cat      10,000BAU     HS 3/15   Dog     1:20     HS 0/0   Mouse    1:20    G 0/0   D.f. Mite     10,000AU     G 7/15   D.p. Mite     10,000AU     G 5/10   Roach     1:20     G 5/10   Tree Mix 4/6   Grass Mix # 5     100,000BAU     G 3/15   Ragweed     1:20     G 7/15   Alternaria Alternata   1:10    HS 5/9   Bipolaris Sorokiniana   1:10    HS 3/5   Hormodendrum Cladospor.   1:10    HS 2/5   Aspergillus Mix   1:10    HS 3/6   Penicillium Mix   1:10    HS 4/7     Skin test interpretation: The patient demonstrated an appropriate response to histamine.  Percutaneous testing to shellfish showed large positive reactions to crustaceans including shrimp, crab, lobster.  Percutaneous testing to mollusks showed small reactions to clam and oyster.  There was also a  small reaction that met the minimum criteria for positivity to scallop.  Percutaneous testing to a panel of common aeroallergens was positive to cat, dust mites, cockroach, tree mix, grass mix, ragweed, and several molds.      IMPRESSION & PLAN:  Beckett Maden is a 34 y.o. male presenting for evaluation of:    1.  Shellfish allergy  -Skin  testing was positive to shrimp, crab, lobster, clam, and oyster.  There was also a small reaction that met the minimum criteria for positivity to scallop.  -Continue avoidance of shrimp, crab, lobster, clam, and oyster.  -Okay to keep scallop in diet on a regular basis to maintain tolerance.  Monitor closely for symptoms.  Risks regarding cross-contamination with other shellfish were discussed.  -Epinephrine to be available at all times.  Teaching points were reviewed by myself and by nursing.  Written information was provided.  The patient was able to demonstrate appropriate use of an epinephrine autoinjector with EpiPen trainer.    2.  Allergic rhinitis  -Skin testing was positive to cat, dust mite, cockroach, trees, grasses, weeds, and molds.  -Allergen avoidance measures were reviewed.  -Recommend taking cetirizine 10 mg or fexofenadine 180 mg on a daily basis.  -Would likely benefit from a nasal steroid, but he would prefer to avoid nasal spray use at this time.      Follow up: As needed    Thank you for allowing [place] to participate in the care of your patient. Please do not hesitate to contact us with any questions or concerns.     Sincerely,    Juel Burrow, [place]  Allergy & Immunology  [facility]  [address], [address]  [place], [place] [address]  Clinic phone: [phone]

## 2020-10-17 ENCOUNTER — Ambulatory Visit: Payer: Medicaid Other | Admitting: Sleep Medicine

## 2020-10-17 DIAGNOSIS — G4733 Obstructive sleep apnea (adult) (pediatric): Secondary | ICD-10-CM

## 2020-10-17 NOTE — Progress Notes (Signed)
ARRIVED FOR HSAT PICK UP    Sleep MD:  MEY    Estimated return date:  10/18/20     James Price on 10/17/2020 at 12:30 PM

## 2020-10-31 NOTE — Progress Notes (Signed)
RETURNED HSAT DEVICE    Data quality: inadequate, sent for rescheduling - 2 ND ATTEMPT. ONLY 2 HOURS OF RECORDED DATA    Follow up date: Visit date not found     James Price on 10/31/2020 at 2:33 PM

## 2020-11-04 ENCOUNTER — Emergency Department
Admission: EM | Admit: 2020-11-04 | Discharge: 2020-11-04 | Disposition: A | Discharge status: Home / Self Care | Payer: Medicaid Other | Source: Ambulatory Visit | Attending: Emergency Medicine | Admitting: Emergency Medicine

## 2020-11-04 ENCOUNTER — Emergency Department: Payer: Medicaid Other

## 2020-11-04 ENCOUNTER — Encounter: Payer: Self-pay | Admitting: Physician Assistant

## 2020-11-04 DIAGNOSIS — R079 Chest pain, unspecified: Secondary | ICD-10-CM

## 2020-11-04 DIAGNOSIS — Z789 Other specified health status: Secondary | ICD-10-CM

## 2020-11-04 DIAGNOSIS — Z720 Tobacco use: Secondary | ICD-10-CM

## 2020-11-04 DIAGNOSIS — R002 Palpitations: Secondary | ICD-10-CM | POA: Insufficient documentation

## 2020-11-04 DIAGNOSIS — R9431 Abnormal electrocardiogram [ECG] [EKG]: Secondary | ICD-10-CM

## 2020-11-04 DIAGNOSIS — I4891 Unspecified atrial fibrillation: Secondary | ICD-10-CM | POA: Insufficient documentation

## 2020-11-04 DIAGNOSIS — F17210 Nicotine dependence, cigarettes, uncomplicated: Secondary | ICD-10-CM | POA: Insufficient documentation

## 2020-11-04 HISTORY — DX: Tobacco use: Z72.0

## 2020-11-04 LAB — CBC AND DIFFERENTIAL
Baso # K/uL: 0.1 10*3/uL (ref 0.0–0.1)
Basophil %: 0.6 %
Eos # K/uL: 0.4 10*3/uL (ref 0.0–0.5)
Eosinophil %: 4.6 %
Hematocrit: 46 % (ref 40–51)
Hemoglobin: 15.4 g/dL (ref 13.7–17.5)
IMM Granulocytes #: 0 10*3/uL (ref 0.0–0.0)
IMM Granulocytes: 0.4 %
Lymph # K/uL: 3.5 10*3/uL (ref 1.3–3.6)
Lymphocyte %: 37.8 %
MCH: 29 pg (ref 26–32)
MCHC: 33 g/dL (ref 32–37)
MCV: 88 fL (ref 79–92)
Mono # K/uL: 0.9 10*3/uL — ABNORMAL HIGH (ref 0.3–0.8)
Monocyte %: 10.1 %
Neut # K/uL: 4.3 10*3/uL (ref 1.8–5.4)
Nucl RBC # K/uL: 0 10*3/uL (ref 0.0–0.0)
Nucl RBC %: 0 /100 WBC (ref 0.0–0.2)
Platelets: 408 10*3/uL — ABNORMAL HIGH (ref 150–330)
RBC: 5.3 MIL/uL (ref 4.6–6.1)
RDW: 14.1 % (ref 11.6–14.4)
Seg Neut %: 46.5 %
WBC: 9.3 10*3/uL — ABNORMAL HIGH (ref 4.2–9.1)

## 2020-11-04 LAB — BASIC METABOLIC PANEL
Anion Gap: 15 (ref 7–16)
CO2: 23 mmol/L (ref 20–28)
Calcium: 9 mg/dL (ref 9.0–10.3)
Chloride: 102 mmol/L (ref 96–108)
Creatinine: 0.94 mg/dL (ref 0.67–1.17)
GFR,Black: 120 *
GFR,Caucasian: 104 *
Glucose: 122 mg/dL — ABNORMAL HIGH (ref 60–99)
Lab: 11 mg/dL (ref 6–20)
Potassium: 3.9 mmol/L (ref 3.3–5.1)
Sodium: 140 mmol/L (ref 133–145)

## 2020-11-04 LAB — TSH: TSH: 1.34 u[IU]/mL (ref 0.27–4.20)

## 2020-11-04 LAB — TROPONIN T 0 HR HIGH SENSITIVITY (IP/ED ONLY): TROP T 0 HR High Sensitivity: 6 ng/L (ref 0–21)

## 2020-11-04 LAB — MAGNESIUM: Magnesium: 2.1 mg/dL (ref 1.6–2.5)

## 2020-11-04 NOTE — ED Provider Notes (Addendum)
History     Chief Complaint   Patient presents with    Chest Pain     Patient is a pleasant 34 year old male with past medical history significant for paroxysmal A. fib, hyperlipidemia, GERD, tobacco abuse, obesity, presenting to the emergency department with palpitations and chest pain.  Record review shows that patient had an visit to the emergency department in September 2021 for similar symptoms, was found to be in paroxysmal A. fib is a new diagnosis, he was admitted and cardioverted successfully.  He was eventually discharged home, followed up with Dr. Lyndel Pleasure cardiologist as an outpatient.  Exercise stress echo was unremarkable.  Cardioversion was successful.  Patient was initially discharged home with metoprolol twice daily which was discontinued on his follow-up.  He was given a prescription for metoprolol 25 mg twice daily as needed palpitations.   Since this follow-up visit, patient states he has had to use his as needed metoprolol at least twice a week.  States that he gets episodes of palpitations often when he is resting along with a mild localized sensation of chest pain in the left sternal area.  The pain usually resolves fairly quickly after taking metoprolol.  Yesterday he used the last of his as needed metoprolol.  This morning he woke up with similar symptoms he has been experiencing lately but was out of metoprolol.  Endorses waking up with palpitations, similar localized sharp chest pain that resolved.  Did have shortness of breath during the episode but this has also resolved at this time.  Also endorses feeling lightheaded which has also resolved.  Patient did not have any metoprolol remaining, took 2 full-strength aspirins and presented to the emergency department for concern.   Denies fever, chills, cough, rhinorrhea, abdominal pain, problems with eating or drinking.  Is still using tobacco products but interested in quitting.  Has not had any alcohol since his cardioversion.  No history  of PE, DVT.  No history of cancer, no recent immobilization.      History provided by:  Patient and medical records  Language interpreter used: No        Medical/Surgical/Family History     Past Medical History:   Diagnosis Date    Obesity     Paroxysmal atrial fibrillation         Patient Active Problem List   Diagnosis Code    Other chest pain R07.89    Obesity, unspecified E66.9    Hyperlipidemia, unspecified E78.5    GERD (gastroesophageal reflux disease) K21.9    Prediabetes R73.03    Paroxysmal atrial fibrillation I48.0    Tobacco use Z72.0            Past Surgical History:   Procedure Laterality Date    ORIF right tibia Right      Family History   Problem Relation Age of Onset    Heart Disease Mother     High Blood Pressure Mother     Substance abuse Mother     No Known Problems Sister     No Known Problems Brother     Substance abuse Maternal Grandmother           Social History     Tobacco Use    Smoking status: Current Every Day Smoker     Packs/day: 0.50     Types: Cigarettes    Smokeless tobacco: Never Used   Substance Use Topics    Alcohol use: Yes     Comment: 1-2 times month  Drug use: No     Comment: previous use x 3 weeks     Living Situation     Questions Responses    Patient lives with Spouse    Homeless No    Caregiver for other family member     External Services     Employment Disabled    Domestic Violence Risk                 Review of Systems   Review of Systems   Constitutional: Negative for chills and fever.   HENT: Negative for facial swelling, hearing loss, rhinorrhea and sore throat.    Eyes: Negative for redness.   Respiratory: Positive for shortness of breath (during palpitations, resolved). Negative for cough.    Cardiovascular: Positive for chest pain (resolved) and palpitations (resolved). Negative for leg swelling.   Gastrointestinal: Negative for abdominal pain, nausea and vomiting.   Genitourinary: Negative for dysuria.   Musculoskeletal: Negative for back  pain and gait problem.   Skin: Negative for wound.   Neurological: Positive for light-headedness (during palpitations, resolved). Negative for syncope.   Psychiatric/Behavioral: Negative for agitation and behavioral problems.       Physical Exam     Triage Vitals  Triage Start: Start, (11/04/20 0606)   First Recorded BP: (!) 167/92, Resp: 16, Temp: 36.2 C (97.2 F), Temp src: TEMPORAL Oxygen Therapy SpO2: 99 %, Oximetry Source: Lt Hand, O2 Device: None (Room air), Heart Rate: 94, (11/04/20 [id number])  .  First Pain Reported  0-10 Scale: 8, (11/04/20 [id number])       Physical Exam  Vitals and nursing note reviewed.   Constitutional:       Appearance: Normal appearance.   HENT:      Head: Normocephalic.      Right Ear: External ear normal.      Left Ear: External ear normal.   Eyes:      Extraocular Movements: Extraocular movements intact.   Neck:      Vascular: No JVD.   Cardiovascular:      Rate and Rhythm: Normal rate and regular rhythm.      Pulses: Normal pulses.      Heart sounds: Normal heart sounds.   Pulmonary:      Effort: Pulmonary effort is normal.      Breath sounds: Normal breath sounds.   Abdominal:      Palpations: Abdomen is soft.      Tenderness: There is no abdominal tenderness. There is no guarding.   Musculoskeletal:         General: Normal range of motion.      Right lower leg: No tenderness. No edema.      Left lower leg: No tenderness. No edema.   Skin:     General: Skin is warm and dry.   Neurological:      General: No focal deficit present.      Mental Status: He is alert and oriented to person, place, and time.   Psychiatric:         Mood and Affect: Mood normal.         Behavior: Behavior normal.         Medical Decision Making     Assessment:  Patient is a pleasant 34 year old male with past medical history significant for paroxysmal A. fib, hyperlipidemia, GERD, tobacco abuse, obesity, presenting to the emergency department with palpitations and chest pain.  Record review shows that patient had an  visit to the  emergency department in September 2021 for similar symptoms, was found to be in paroxysmal A. fib is a new diagnosis, he was admitted and cardioverted successfully.  He was eventually discharged home, followed up with Dr. Lyndel Pleasure cardiologist as an outpatient.  Exercise stress echo was unremarkable.  Cardioversion was successful.  Patient was initially discharged home with metoprolol twice daily which was discontinued on his follow-up.  He was given a prescription for metoprolol 25 mg twice daily as needed palpitations.   Since this follow-up visit, patient states he has had to use his as needed metoprolol at least twice a week.  States that he gets episodes of palpitations often when he is resting along with a mild localized sensation of chest pain in the left sternal area.  The pain usually resolves fairly quickly after taking metoprolol.  Yesterday he used the last of his as needed metoprolol.  This morning he woke up with similar symptoms he has been experiencing lately but was out of metoprolol.  Endorses waking up with palpitations, similar localized sharp chest pain that resolved.  Did have shortness of breath during the episode but this has also resolved at this time.  Also endorses feeling lightheaded which has also resolved.  Patient did not have any metoprolol remaining, took 2 full-strength aspirins and presented to the emergency department for concern.   Denies fever, chills, cough, rhinorrhea, abdominal pain, problems with eating or drinking.  Is still using tobacco products but interested in quitting.  Has not had any alcohol since his cardioversion.  No history of PE, DVT.  No history of cancer, no recent immobilization.        Differential diagnosis:  Paroxysmal A. fib, recurrent A. Fib after ablation, arrhythmia, electrolyte disturbance  Less likely acute coronary syndrome, pericarditis  Unlikely PE, COVID, CHF     Plan:  Orders Placed This Encounter      *Chest standard frontal and  lateral views      CBC and differential      Basic metabolic panel      Troponin T 0 HR High Sensitivity      Troponin T 3 HR W/ Delta High Sensitivity      Magnesium      TSH      Magnesium      TSH      EKG: initial      EKG: follow up    History and physical most consistent with continued paroxysmal A. fib not captured on EKG.  Patient is currently chest pain and palpitation free. No exertional component as these episodes have often occurred at rest. Being seen in [facility] area without monitored telemetry available due to current ED volume. Will obtain repeat EKG PRN palpitations or chest pain. Suspect that patient has had multiple recurring episodes of A. fib that are currently being treated with as needed metoprolol.     8:06 AM  Results discussed with patient. Plan for discharge home with follow up cardiology. Patient states he was recently asked his pharmacy and declined to fill the metoprolol, he has prescription available for refill at [facility] on [address]. Would recommend he continue metoprolol 25 mg BID prn palpitations.     Patient has remained well appearing, vital signs stable. Plan for discharge discussed with patient, all questions answered. Recommend followup with primary care in 2-3 days.   Return to ED with new or worsening symptoms.     EKG Interpretation:  Rate 85, sinus rhythm , normal sinus rhythm,  no ischemic changes    Independent review of: XRays, chart/prior records         ED Course as of 11/04/20 0806   [facility] Nov 04, 2020   0805 *Chest standard frontal and lateral views  unremarkable   0805 WBC(!): 9.3   0805 Platelets(!): 408   0805 TROP T 0 HR High Sensitivity: 6   0805 TSH: 1.34   0805 Magnesium: 2.1       Bethanne Ginger, PA      APP Review:    I had face-to-face interaction with the patient on 11/04/2020.    I was asked by APP to see this patient due to the complexity of the current medical presentation.      I have reviewed and agree with the above documentation and, in  addition:    The history is notable for Palpitations with known history of Afib.    Exam is notable for Well appearing, non toxic.    Patient at risk for afib.    Our plan is Discharge with cardiology followup and refill of BB script.          Author:  Debby Bud, MD       Bethanne Ginger, [place]  11/04/20 2683       Debby Bud, MD  11/04/20 [phone]

## 2020-11-04 NOTE — Discharge Instructions (Signed)
You were seen in the [facility] Emergency Department for palpitations, chest pain.  Your work up has included bloodwork, EKG, chest xray. Your testing revealed no acute findings. It is very important to follow up as recommended and take all medications prescribed.      Medications:  RESUME taking metoprolol 25mg  twice daily as needed for palpitations. This medication is ready for refill at [facility] on [facility].   Continue your routine medications as previously prescribed.    Follow Up:   Follow up with your primary care doctor within 2-5 days of discharge.  Follow up with your cardiology doctor within 4 weeks of discharge.    Other Information:   Please read all attached information provided.    Return to the emergency department with any worsening or concerning signs or symptoms.    Thank you for allowing [employer] to care for you.

## 2020-11-04 NOTE — ED Notes (Signed)
Pt discharge instructions reviewed. Pt comfortable with discharge planning and verbalizes understanding of discharge instruction. Pt ambulatory without assistance and steady gait. Tolerated PO. Pt will follow up with PCP. Pt belongings with pt. Pt safe to discharge at this time.

## 2020-11-04 NOTE — ED Triage Notes (Signed)
Pt woke from sleeping with chest pain and SOB around 0500 this morning.  EKG in triage           Prehospital medications given: No

## 2020-11-05 LAB — EKG 12-LEAD
P: 70 deg
PR: 161 ms
QRS: 62 deg
QRSD: 84 ms
QT: 337 ms
QTc: 402 ms
Rate: 85 {beats}/min
T: 86 deg

## 2020-11-08 ENCOUNTER — Other Ambulatory Visit: Payer: Self-pay | Admitting: Family Medicine

## 2020-11-08 DIAGNOSIS — K219 Gastro-esophageal reflux disease without esophagitis: Secondary | ICD-10-CM

## 2020-11-12 ENCOUNTER — Encounter: Payer: Self-pay | Admitting: Sleep Medicine

## 2020-11-12 MED ORDER — OMEPRAZOLE 20 MG PO CPDR *I*
20.0000 mg | DELAYED_RELEASE_CAPSULE | Freq: Every day | ORAL | 5 refills | Status: DC
Start: 2020-11-12 — End: 2020-11-28

## 2020-11-12 MED ORDER — CPAP MACHINE *A*
0 refills | Status: AC
Start: 2020-11-12 — End: ?

## 2020-11-12 NOTE — Addendum Note (Signed)
Addended by: Fredrik Cove on: 11/12/2020 02:58 PM     Modules accepted: Orders

## 2020-11-12 NOTE — Procedures (Addendum)
This is a time-limited, unattended home sleep study.  The study is consistent with moderate obstructive sleep apnea.  Baseline oxygen saturation generally ranged 91-92%.  Cyclic variation in heart rate was noted with respiratory disturbances.        This study was electronically signed by Fredrik Cove, MD on 11/12/2020 at 2:52 PM.

## 2020-11-19 ENCOUNTER — Encounter: Payer: Self-pay | Admitting: Family Medicine

## 2020-11-21 ENCOUNTER — Telehealth: Payer: Self-pay | Admitting: Sleep Medicine

## 2020-11-21 NOTE — Telephone Encounter (Signed)
Copied from CRM [id number]. Topic: Medications/Prescriptions - Prior Authorization  >> Nov 21, 2020  4:05 PM Andria Frames wrote:  [employer] is calling to advise that the patients authorization was approved for E0601 & X8333    The authorization number is: [id number]  The duration of the authorization ranges from: 11/20/2020-09/20/2021      N[employer]ha[employer] can be reached if necessary at [phone].

## 2020-11-25 ENCOUNTER — Telehealth: Payer: Self-pay

## 2020-11-25 NOTE — Telephone Encounter (Signed)
Pt seen this morning and set up on an Airsense 11 CPAP machine with settings:  Auto-titrate 5-20 cmH2O as per prescription.  He was fit for an Airfit N30i standard frame and given a small-wide and a wide cushion.  He will let us know which one he is using at his first supply replacement.  His information has been entered into [employer]view for remote compliance monitoring.  Will continue to follow to support compliance and provide supply replacement.  This pt does not require a CPAP f/u appt.

## 2020-11-25 NOTE — Telephone Encounter (Signed)
Received communication from [facility] that patient has been set up with equipment.    Set-up date: 11/26/19   Device: S11 auto   Pressure settings: 5-20cmH2O   Mask: N30 I   PAP follow up needed: no   Appointment scheduled: no   On [employer]View: yes     Lazarus Salines

## 2020-11-28 ENCOUNTER — Encounter: Payer: Self-pay | Admitting: Family Medicine

## 2020-11-28 ENCOUNTER — Ambulatory Visit: Payer: Medicaid Other | Admitting: Family Medicine

## 2020-11-28 VITALS — BP 136/69 | HR 94 | Temp 97.0°F | Ht 66.0 in | Wt 289.0 lb

## 2020-11-28 DIAGNOSIS — G4733 Obstructive sleep apnea (adult) (pediatric): Secondary | ICD-10-CM

## 2020-11-28 DIAGNOSIS — E669 Obesity, unspecified: Secondary | ICD-10-CM

## 2020-11-28 DIAGNOSIS — K219 Gastro-esophageal reflux disease without esophagitis: Secondary | ICD-10-CM

## 2020-11-28 DIAGNOSIS — R079 Chest pain, unspecified: Secondary | ICD-10-CM

## 2020-11-28 DIAGNOSIS — M79602 Pain in left arm: Secondary | ICD-10-CM

## 2020-11-28 HISTORY — DX: Obstructive sleep apnea (adult) (pediatric): G47.33

## 2020-11-28 MED ORDER — IBUPROFEN 600 MG PO TABS *I*
600.0000 mg | ORAL_TABLET | Freq: Three times a day (TID) | ORAL | 2 refills | Status: DC | PRN
Start: 2020-11-28 — End: 2021-12-18

## 2020-11-28 MED ORDER — OMEPRAZOLE 40 MG PO CPDR *I*
40.0000 mg | DELAYED_RELEASE_CAPSULE | Freq: Every day | ORAL | 2 refills | Status: DC
Start: 2020-11-28 — End: 2021-01-02

## 2020-11-28 NOTE — Progress Notes (Signed)
Subjective    History:    James Price is a 35 y.o. male who presents for Referral - New (cardio)      HPI     Chest pain and Left arm Pain  - Ongoing symptoms for 4+ months, several ED visits, as well as being evaluated by cardiology.  Symptoms are occurring every day, lasting for "a while ", usually improving when he lays down but will return later on in the day.  Symptoms are not associated with activity, can occur if he is just sitting still.  -Chest pain is described as sharp, does not radiate.  However, if he has left arm ache, his chest does "feel funny". Also endorses epigastric burning.  -Left arm pain is described as achy, changes locations.  Sometimes located in left shoulder, left elbow, left wrist.  -Continues to have intermittent palpitations, which improves with metoprolol.  Requiring this every other day.  -No leg swelling.  -No shortness of breath.  - Does have neck pain, sometimes with certain movement of neck.   - Requesting "a full body scan" for ongoing chest pain.    OSA  Received CPAP on Monday. Has used twice, throughout the night. Was able to tolerate.     Exercise Stress Echo  07/2020  1.  Normal LV size, mass, and EF without regional wall motion abnormalities  2.  No significant valvular abnormalities  3.  Normal right heart size and function with estimated normal pulmonary artery systolic pressure   4.  No ischemic ECG changes or arrhythmias with exercise   5.  Negative exercise stress echo for myocardial ischemia at the cardiac workload achieved            Objective   Examination:  Vitals:    11/28/20 0840   BP: 136/69   Pulse: 94   Temp: 36.1 C (97 F)   TempSrc: Temporal   Weight: 131.1 kg (289 lb)   Height: 1.676 m (5\' 6" )     Body mass index is 46.65 kg/m.    Wt Readings from Last 3 Encounters:   11/28/20 131.1 kg (289 lb)   11/04/20 131.5 kg (290 lb)   10/16/20 133.3 kg (293 lb 12.8 oz)     BP Readings from Last 3 Encounters:   11/28/20 136/69   11/04/20 156/76   10/16/20  137/72       Physical Exam  HENT:      Head: Normocephalic.   Pulmonary:      Effort: Pulmonary effort is normal. No respiratory distress.   Chest:      Chest wall: Tenderness present.   Musculoskeletal:      Comments: Left arm: Full ROM, but does have some pain with position change. 5/5 muscle strength. 2+ radial pulses. + Neer and Empty can test  No pain on palpation of neck.   Neurological:      Mental Status: He is alert and oriented to person, place, and time.   Psychiatric:         Mood and Affect: Mood and affect normal.         Cognition and Memory: Memory normal.         Judgment: Judgment normal.           Assessment         Assessment/Plan:    1. Chest pain, unspecified type  2. GERD (gastroesophageal reflux disease)  Established problem.  Extensive prior work up, with unremarkable cardiac stress test. Exam notable  for reproducible chest pain, making etiology likely MSK in nature vs uncontrolled acid reflex. Reassurance provided to patient. Will trial NSAIDs, increased dose of omeprazole and referral to PT.  I did advise pt to schedule a sooner follow-up with cardiology given frequent use of metoprolol. Pt agreeable, has their telephone number.    - omeprazole (PRILOSEC) 40 mg capsule; Take 1 capsule (40 mg total) by mouth daily (before breakfast)  for Gastroesophageal Reflux Disease  Dispense: 30 capsule; Refill: 2    3. Left arm pain  Established problem, Suspect impingement syndrom d/t + Neer and empty can test. Reassurance provided. Trial ibuprofen and referral to PT.    - AMB REFERRAL TO PHYSICAL / OCCUPATIONAL THERAPY  - ibuprofen (ADVIL,MOTRIN) 600 mg tablet; Take 1 tablet (600 mg total) by mouth 3 times daily as needed for Pain  Dispense: 30 tablet; Refill: 2    4. Obesity, unspecified classification, unspecified obesity type, unspecified whether serious comorbidity present  Congratulated patient on 20 lb weight loss. Continue healthy lifestyle with goal of continuing to lose weight.    5. OSA  (obstructive sleep apnea)  Has tolerated CPAP the last two nights, continue.    Return in about 6 weeks (around 01/09/2021).          (MyChart Activation Status- Activated [1])  Link to [facility] MyChart Administration     Georgeanne Nim, NP   [facility]  [address] [place]  [place] [address]  Dept: [phone]  Dept Fax: [phone]

## 2020-12-24 ENCOUNTER — Ambulatory Visit: Payer: Medicaid Other | Admitting: Physical Therapy

## 2020-12-30 ENCOUNTER — Ambulatory Visit: Payer: Medicaid Other | Admitting: Physical Therapy

## 2020-12-30 NOTE — Progress Notes (Deleted)
Upper Extremity and Hand [facility]  PT Initial Evaluation    History  No diagnosis found.  Georgeanne Nim, NP  [address]  [address]  [place],  [place] [address]    Onset date: ***      Subjective    James Price is a 35 y.o. male who is present today for left, shoulder pain.  Mechanism of injury/history of symptoms: No specific cause    Pt has received care for 0 visits to date.    Occupation and Activities  Work status: {MISC; WORK YKDXIP:38250}  Job title/type of work: {Type of [id number]  Stresses/physical demands of job: {Physical demands o NLZJ:67341}  Stresses/physical demands of home: { physical demands of [phone]}  Sport(s): {Misc; sports:19162}  Other: {NA/explanation:16022516}  Diagnostic tests: {Diagnositic tests:26760}      No chief complaint on file.    Symptom location: {anterior/posterior/lateral:12563}, {Left/right:33004}  Relevant symptoms: {PT UE symptoms:26762}  Symptom frequency: {Desc; intermittent/persistent/constant:12478}  Symptom intensity (0 - 10 scale): Now {PAIN PFXTKW:40973} Best {PAIN LEVELS:22940} Worst {PAIN LEVELS:22940}    Night Pain: {MODEL YES/NO:200010}       Morning Pain/Stiffness: {Increased/decreased/na:15831}   Symptoms worsen with: {Worsening symptoms:26765}  Symptoms improve with: { symptoms improve w/:[phone]}  Assistive device:  NA  Patients goals for therapy: Perform all self care ADLs (bathing, hygiene, dressing, eating) independently, Reduce pain, Increase ROM / flexibility, Increase strength, Achieve independence with home program for self care / condition management     Objective:    Observation: Unremarkable  Sensibility Screen: Grossly intact to LT   Palpation: {Swelling pain deformity:27279}  Scar:  NA      ROM/Strength  (unoted implies WFL or NA Manual Muscle Testing Scores_/5 )  UE      AROM AROM PROM PROM MMT MMT    Right Left Right Left Right Left   Shoulder          Forward Flexion         Extension         Abduction          Internal  Rotation         External Rotation                          Hand Strength: Jamar dynamometer (kg)    Position Right Left   1     2     3     4     5          Pinch (kg) Right Left   Lateral     Tip (2 point)     Tip (3 point)       Hand dominance:  {RIGHT/LEFT:20872}    Special Tests:       Shoulder:  Neer,  {Positive/Negative:27025}  ,  {Positive/Negative:27025}  Subscapularis lift off,  {Positive/Negative:27025}  Speed's,  {Positive/Negative:27025}  O'Brien,  {Positive/Negative:27025}           Assessment:  Findings consistent with 35 y.o., male with No diagnosis found.  with pain, ROM limitations, strength limitations, functional limitations.    Personal factors affecting treatment/recovery:   none identified  Comorbidities affecting treatment/recovery:   none noted  Clinical presentation:   stable  Patient complexity:     low level as indicated by above stability of condition, personal factors, environmental factors and comorbidities in addition to patient symptom presentation and impairments found on physical exam.    Prognosis:  Good  Contraindications/Precautions/Limitation:  Per diagnosis  Short Term Goals:  {Short term goals:23710}  Long Term Goals:  {Long Term Goals:23711}  Recommend medically necessary rehabilitation to facilitate improvement and/or functional ability to ***.    Treatment Plan:   Patient/family involved in developing goals and treatment plan: Yes  Expected length of care 8 week(s)  Freq 1-2 times per week for 8 week    Treatment plan inclusive of:   Exercise: AROM, AAROM, PROM, Stretching, Strengthening, Progressive Resistive   Manual Techniques:  Myofascial Release, Joint mobilization, Soft tissue release/massage   Modalities:  Cryotherapy, Moist heat, Ultrasound    Functional: Functional rehab, Postural training, Self-care, Home management      Request additional:  12 visits for therapy.     Thank you for the referral of this patient to Upper Extremity and Hand  Rehabilitation. Please do not hesitate to contact me if I may be of assistance.      Please submit written approval or fax [phone]    Biagio Borg, PT

## 2020-12-31 ENCOUNTER — Telehealth: Payer: Self-pay

## 2020-12-31 NOTE — Telephone Encounter (Signed)
mailbox full could not leave message for patient to reschedule missed appt. from 12-30-20

## 2021-01-02 ENCOUNTER — Other Ambulatory Visit: Payer: Self-pay | Admitting: Family Medicine

## 2021-01-02 DIAGNOSIS — K219 Gastro-esophageal reflux disease without esophagitis: Secondary | ICD-10-CM

## 2021-01-02 MED ORDER — OMEPRAZOLE 40 MG PO CPDR *I*
40.0000 mg | DELAYED_RELEASE_CAPSULE | Freq: Every day | ORAL | 2 refills | Status: DC
Start: 2021-01-02 — End: 2021-09-18

## 2021-01-03 NOTE — Telephone Encounter (Signed)
Continuing to follow pt to support compliance with CPAP therapy.  Have not been able to speak to pt in the past 2 weeks.  Sent MyChart message this morning but this pt may benefit from a conversation with someone at the lab.

## 2021-01-07 ENCOUNTER — Ambulatory Visit: Payer: Medicaid Other | Admitting: Family Medicine

## 2021-01-07 ENCOUNTER — Encounter: Payer: Self-pay | Admitting: Family Medicine

## 2021-01-07 VITALS — BP 142/81 | HR 86 | Temp 97.0°F | Ht 66.0 in | Wt 293.0 lb

## 2021-01-07 DIAGNOSIS — F17200 Nicotine dependence, unspecified, uncomplicated: Secondary | ICD-10-CM

## 2021-01-07 DIAGNOSIS — Z0000 Encounter for general adult medical examination without abnormal findings: Secondary | ICD-10-CM

## 2021-01-07 DIAGNOSIS — E669 Obesity, unspecified: Secondary | ICD-10-CM

## 2021-01-07 DIAGNOSIS — G4733 Obstructive sleep apnea (adult) (pediatric): Secondary | ICD-10-CM

## 2021-01-07 DIAGNOSIS — M79602 Pain in left arm: Secondary | ICD-10-CM

## 2021-01-07 DIAGNOSIS — R030 Elevated blood-pressure reading, without diagnosis of hypertension: Secondary | ICD-10-CM

## 2021-01-07 DIAGNOSIS — K219 Gastro-esophageal reflux disease without esophagitis: Secondary | ICD-10-CM

## 2021-01-07 NOTE — Progress Notes (Signed)
Subjective    History:    James Price is a 35 y.o. male who presents for Follow-up      HPI     Chest pain and Left arm Pain f/u  - Thought to be MSK in nature. Referral placed to PT, no showed appointment. Left shoulder pain has resolved. Chest pain has improved. Now described as GERD. Also, some discomfort with turning, overall improving.  - Sx improved with omeprazole.   - No palpitations. Has not needed metoprolol in several weeks. Cardiology appointment scheduled for the end of March.  - No longer having palpitations. Has not needed metoprolol. Last episode was 2-3 weeks ago.     Nicotine dependence  15 years, at most 1 pack/day. Down to 1/2 pack a day. Did not fill nicorette gum d/t cost.     OSA  Not compliant with CPAP. Works [employer], forgets to use in the morning. Remembers if he has the day off. Tolerates ok.     Exercise Stress Echo  07/2020  1.  Normal LV size, mass, and EF without regional wall motion abnormalities  2.  No significant valvular abnormalities  3.  Normal right heart size and function with estimated normal pulmonary artery systolic pressure   4.  No ischemic ECG changes or arrhythmias with exercise   5.  Negative exercise stress echo for myocardial ischemia at the cardiac workload achieved            Objective   Examination:  Vitals:    01/07/21 1330   BP: 142/81   Pulse: 86   Temp: 36.1 C (97 F)   TempSrc: Temporal   Weight: 132.9 kg (293 lb)   Height: 1.676 m (5\' 6" )     Body mass index is 47.29 kg/m.    Wt Readings from Last 3 Encounters:   01/07/21 132.9 kg (293 lb)   11/28/20 131.1 kg (289 lb)   11/04/20 131.5 kg (290 lb)     BP Readings from Last 3 Encounters:   01/07/21 142/81   11/28/20 136/69   11/04/20 156/76       Physical Exam  HENT:      Head: Normocephalic.   Pulmonary:      Effort: Pulmonary effort is normal. No respiratory distress.   Neurological:      Mental Status: He is alert and oriented to person, place, and time.   Psychiatric:         Mood and Affect:  Mood and affect normal.         Cognition and Memory: Memory normal.         Judgment: Judgment normal.           Assessment         Assessment/Plan:    1. Gastroesophageal reflux disease, unspecified whether esophagitis present  Established problem. Improving. Reviewed lifestyle modifications. Continue omeprazole.     2. Left arm pain  Resolved. No follow-up needed.     3. OSA (obstructive sleep apnea)  Non compliant with CPAP, does not remember to use after working overnights. Reviewed long term risk of untreated sleep apnea.     4. Elevated blood pressure reading without diagnosis of hypertension  Mildly elevated blood pressure, historically at goal. Focus on lifestyle and OSA treatment. If persistently elevated, can start antihypertensive therapy.    5. Obesity, unspecified classification, unspecified obesity type, unspecified whether serious comorbidity present  Continue to work on lifestyle modifications.    6. Nicotine dependence, unspecified, uncomplicated  -  More than three minutes of smoking cessation counseling done.  Patient co-morbidities include None    Risks of smoking reviewed including possible lung disease (COPD, cancer) and cardiac disease.  Options of cessation aides reviewed including nicotine patch, hypnosis, and possible prescription medications.  Patient encouraged to follow-up with outpatient physician.    - AMB [place] QUITS ENROLLMENT    7. Healthcare maintenance    - HIV 1&2 antigen/antibody; Future  - Hepatitis C antibody; Future      Return in about 6 months (around 07/07/2021).          (MyChart Activation Status- Activated [1])  Link to [facility] MyChart Administration     Georgeanne Nim, NP   [facility]  [address] [place], [address]  [place] [address]  Dept: [phone]  Dept Fax: [phone]

## 2021-02-12 ENCOUNTER — Ambulatory Visit: Payer: Medicaid Other | Admitting: Cardiology

## 2021-02-15 ENCOUNTER — Other Ambulatory Visit: Payer: Self-pay | Admitting: Family Medicine

## 2021-02-15 DIAGNOSIS — K219 Gastro-esophageal reflux disease without esophagitis: Secondary | ICD-10-CM

## 2021-02-17 ENCOUNTER — Other Ambulatory Visit: Payer: Self-pay | Admitting: Family Medicine

## 2021-02-17 DIAGNOSIS — K219 Gastro-esophageal reflux disease without esophagitis: Secondary | ICD-10-CM

## 2021-03-21 ENCOUNTER — Ambulatory Visit: Payer: Medicaid Other | Admitting: Cardiology

## 2021-03-25 ENCOUNTER — Telehealth: Payer: Self-pay | Admitting: Cardiology

## 2021-03-25 NOTE — Telephone Encounter (Signed)
5/6 Patient missed follow up visit with Dr. Lyndel Pleasure.Garret Reddish a person answered but didn't listen to entire message on 5/9.

## 2021-03-28 NOTE — Telephone Encounter (Signed)
5/13 attempted to call patient mailbox is full.

## 2021-04-01 ENCOUNTER — Encounter: Payer: Self-pay | Admitting: Cardiology

## 2021-04-01 NOTE — Telephone Encounter (Signed)
5/17 no show letter sent via mychart. Done.

## 2021-04-02 ENCOUNTER — Encounter: Payer: Self-pay | Admitting: Family Medicine

## 2021-04-16 ENCOUNTER — Encounter: Payer: Self-pay | Admitting: Family Medicine

## 2021-04-16 ENCOUNTER — Ambulatory Visit: Payer: Medicaid Other | Admitting: Family Medicine

## 2021-04-16 VITALS — BP 120/72 | HR 86 | Temp 96.4°F | Ht 65.98 in | Wt 292.0 lb

## 2021-04-16 DIAGNOSIS — M25561 Pain in right knee: Secondary | ICD-10-CM

## 2021-04-16 DIAGNOSIS — Z0000 Encounter for general adult medical examination without abnormal findings: Secondary | ICD-10-CM

## 2021-04-16 MED ORDER — DICLOFENAC SODIUM 1 % EX GEL *I*
CUTANEOUS | 0 refills | Status: AC
Start: 2021-04-16 — End: ?

## 2021-04-16 NOTE — Progress Notes (Signed)
[facility]     OUTPATIENT      OFFICE VISIT           Chief Complaint   Patient presents with    Annual Exam    Leg Pain     right leg             HPI     Right knee pain  - Intermittent right knee pain for the past month.   - Knee intermittently gives out when walking.  - No know injury.  - No radiation of pain.   - No numbness or tingling of leg.  - No other joint pain.   - No hip or ankle pain  - No lower back pain  - No fever or chills  - No family history of autoimmune diseases.   - Has not taken anything for pain.                 Examination:  Vitals  BP 120/72    Pulse 86    Temp 35.8 C (96.4 F) (Temporal)    Ht 1.676 m (5' 5.98")    Wt 132.5 kg (292 lb)    BMI 47.15 kg/m   Patient entered home BP: No flowsheet data found.     Physical Exam  Constitutional:       General: He is not in acute distress.     Appearance: Normal appearance.   Pulmonary:      Effort: Pulmonary effort is normal.   Musculoskeletal:      Right knee: No swelling, deformity, effusion, erythema, ecchymosis or lacerations. Normal range of motion. Tenderness (Global tenderness) present. No LCL laxity, MCL laxity, ACL laxity or PCL laxity.      Left knee: Normal.   Neurological:      Mental Status: He is alert.   Psychiatric:         Mood and Affect: Mood normal.         Behavior: Behavior normal.         Thought Content: Thought content normal.         Judgment: Judgment normal.             Assessment and Plan     ICD-10-CM ICD-9-CM    1. Healthcare maintenance  Z00.00 V70.0 Lipid Panel (Reflex to Direct  LDL if Triglycerides more than 400)      Hemoglobin A1c   2. Pain in right knee  M25.561 719.46 * Knee RIGHT standard AP, Lateral, Patellar views      AMB REFERRAL TO PHYSICAL / OCCUPATIONAL THERAPY      diclofenac (VOLTAREN) 1 % gel     1. Pain in right knee  New problem, exam is unremarkable. No known injury. Given global tenderness, will further evaluate with imaging. Referral placed to PT for suspected overuse  injury.     - * Knee RIGHT standard AP, Lateral, Patellar views; Future  - AMB REFERRAL TO PHYSICAL / OCCUPATIONAL THERAPY  - diclofenac (VOLTAREN) 1 % gel; Apply 4 g to affected area 4 times daily.  Dispense: 100 g; Refill: 0    2. Healthcare maintenance    - Lipid Panel (Reflex to Direct  LDL if Triglycerides more than 400); Future  - Hemoglobin A1c; Future           Follow up in about 3 months (around 07/17/2021) for Right knee pain.            (MyChart Activation Status-  Activated [1])       Georgeanne Nim, NP   [facility]  [address] [place], [address]  [place] [address]  Dept: [phone]  Dept Fax: [phone]

## 2021-05-21 ENCOUNTER — Ambulatory Visit: Payer: Medicaid Other | Admitting: Rehabilitative and Restorative Service Providers"

## 2021-08-08 ENCOUNTER — Encounter: Payer: Self-pay | Admitting: Cardiology

## 2021-09-05 ENCOUNTER — Ambulatory Visit: Payer: Medicaid Other | Admitting: Cardiology

## 2021-09-09 ENCOUNTER — Telehealth: Payer: Self-pay | Admitting: Cardiology

## 2021-09-09 NOTE — Telephone Encounter (Signed)
Working no shows. Patient missed their fuv with Dr. Lyndel Pleasure on 10/21. The automated system called the patient but could not LMOM.

## 2021-09-18 ENCOUNTER — Other Ambulatory Visit: Payer: Self-pay | Admitting: Family Medicine

## 2021-09-18 DIAGNOSIS — K219 Gastro-esophageal reflux disease without esophagitis: Secondary | ICD-10-CM

## 2021-09-19 MED ORDER — OMEPRAZOLE 40 MG PO CPDR *I*
40.0000 mg | DELAYED_RELEASE_CAPSULE | Freq: Every day | ORAL | 2 refills | Status: DC
Start: 2021-09-19 — End: 2021-12-18

## 2021-09-22 NOTE — Telephone Encounter (Signed)
11/7 Called pt to reschedule. Phone number is disconnected

## 2021-09-26 ENCOUNTER — Encounter: Payer: Self-pay | Admitting: Cardiology

## 2021-09-26 NOTE — Telephone Encounter (Signed)
Sent out no show letter. DONE!

## 2021-12-14 ENCOUNTER — Encounter: Payer: Self-pay | Admitting: Cardiology

## 2021-12-14 ENCOUNTER — Encounter: Payer: Self-pay | Admitting: Allergy/Immunology/Rheumatology

## 2021-12-15 ENCOUNTER — Telehealth: Payer: Self-pay | Admitting: Allergy/Immunology/Rheumatology

## 2021-12-15 NOTE — Telephone Encounter (Signed)
Tried calling pt to lm but VM unavaliable, was able to lm via Mychart informing pt about the next available appt provider has.

## 2021-12-16 ENCOUNTER — Telehealth: Payer: Self-pay | Admitting: Cardiology

## 2021-12-16 ENCOUNTER — Encounter: Payer: Self-pay | Admitting: Cardiology

## 2021-12-16 NOTE — Progress Notes (Signed)
[facility]        Cardiology Office Revisit Note    Date of Visit: 12/18/2021 Patient: James Price   Patients PCP: Kristine Royal, MD Patient DOB: Nov 25, 1985  EMRN: [id number]     Subjective/Reason For Visit     I had the pleasure of seeing James Price in cardiology followup on 12/18/2021 for chest pain.  James Price has a history of PAF, borderline CM, obesity, tobacco use and OSA on CPAP.  HE now comes in with left-sided chest discomfort that radiates down the arm.  It occurs randomly when he is sitting watching TV and also occasionally with exertion.  He has had some increased weight gain due to decreased mobility with his left knee.  He does continue to smoke.  He has noticed increased shortness of breath with exertion.  He denies orthopnea, PND, leg edema, palpitations, dizziness or syncope.  He does not have a blood pressure cuff to check his blood pressure at home.  He has been eating more processed and fast foods.  His father just recently had a heart attack a couple of weeks ago so he is very nervous.  He has not been wearing his CPAP machine since he does not feel that his mask is fitting him appropriately.  The episodes of chest discomfort can occur for seconds to minutes to hours and again occur very randomly.    Past Medical History:   Diagnosis Date    GERD (gastroesophageal reflux disease)     Obesity     OSA (obstructive sleep apnea) 11/28/2020    Paroxysmal atrial fibrillation     Prediabetes 07/16/2020    Tobacco use 11/04/2020     Past Surgical History:   Procedure Laterality Date    ORIF right tibia Right      Review of Systems   Constitutional: Positive for malaise/fatigue.        Has had weight gain   Respiratory: Positive for shortness of breath.    Cardiovascular: Positive for chest pain. Negative for palpitations, orthopnea, leg swelling and PND.   Neurological: Negative for dizziness and loss of consciousness.     Medications     Current Outpatient Medications    Medication Sig    aspirin 325 mg tablet Take 1 tablet (325 mg total) by mouth 2 times daily    omeprazole (PRILOSEC) 40 mg capsule Take 1 capsule (40 mg total) by mouth daily as needed  for Gastroesophageal Reflux Disease    diclofenac (VOLTAREN) 1 % gel Apply 4 g to affected area 4 times daily.    CPAP machine (HCPCS [phone]) 5-20 cm H2O.  Issue all equip inc. Heated hose/humidifier.  Dx:  OSA  Length:99    EPINEPHrine (EPIPEN) 0.3 mg/0.3 mL auto-injector Inject 0.3 mLs (0.3 mg total) into the muscle as needed for Anaphylaxis    metoprolol tartrate (LOPRESSOR) 25 MG tablet Take 1 tablet (25 mg total) by mouth 2 times daily as needed (Palpitations, atrial fibrillation)     Vitals and Physical Exam     Dugan's  height is 1.676 m (5' 5.98") and weight is 142 kg (313 lb) (abnormal). His blood pressure is 136/80 and his pulse is 82. His oxygen saturation is 98%.  Body mass index is 50.55 kg/m.  Repeat blood pressure 158/98  Physical Exam  Vitals reviewed.   Constitutional:       Appearance: Normal appearance. He is obese.   Neck:      Comments: JVP is not  elevated  Cardiovascular:      Rate and Rhythm: Normal rate and regular rhythm.      Heart sounds: Normal heart sounds. No murmur heard.    No friction rub. No gallop.   Pulmonary:      Effort: Pulmonary effort is normal.      Breath sounds: Normal breath sounds. No wheezing, rhonchi or rales.   Musculoskeletal:         General: No swelling.      Cervical back: Neck supple.      Right lower leg: No edema.      Left lower leg: No edema.   Skin:     General: Skin is warm.      Capillary Refill: Capillary refill takes less than 2 seconds.   Neurological:      Mental Status: He is alert.       Laboratory Data     Hematology:   Results in Past 730 Days  Result Component Current Result Previous Result   WBC 9.3 (H) (11/04/2020) 11.2 (H) (08/10/2020)   Hemoglobin 15.4 (11/04/2020) 16.6 (08/10/2020)   Hematocrit 46 (11/04/2020) 50 (08/10/2020)   Platelets 408 (H)  (11/04/2020) 344 (H) (08/10/2020)     Chemistry:   Results in Past 730 Days  Result Component Current Result Previous Result   Sodium 140 (11/04/2020) 140 (08/10/2020)   Potassium 3.9 (11/04/2020) 4.2 (08/10/2020)   Creatinine 0.94 (11/04/2020) 0.88 (08/10/2020)   Glucose 122 (H) (11/04/2020) 139 (H) (08/10/2020)   Calcium 9.0 (11/04/2020) 9.2 (08/10/2020)   Magnesium 2.1 (11/04/2020) 1.9 (08/10/2020)   Hemoglobin A1C 6.1 (H) (07/12/2020) Not in Time Range   AST 21 (07/12/2020) Not in Time Range   ALT 30 (07/12/2020) Not in Time Range   TSH 1.34 (11/04/2020) 0.80 (08/10/2020)     Coagulation Studies:   No results found for requested labs within last 730 days.     Cardiac:   Results in Past 730 Days  Result Component Current Result Previous Result   TROP T 0 HR High Sensitivity 6 (11/04/2020) Not in Time Range     Lipids:   Results in Past 730 Days  Result Component Current Result Previous Result   Cholesterol 191 (07/12/2020) Not in Time Range   HDL 34 (L) (07/12/2020) Not in Time Range   Triglycerides 124 (07/12/2020) Not in Time Range   LDL Calculated 132 (!) (07/12/2020) Not in Time Range   Chol/HDL Ratio 5.6 (07/12/2020) Not in Time Range     Cardiac/Imaging Data & Risk Scores     ECG: Sinus rhythm with a rate of 73 with LVH pattern with lateral T wave flattening that is not new         EXERCISE STRESS ECHO COMPLETE 08/05/2020    Interpretation Summary  1.  Normal LV size, mass, and EF without regional wall motion abnormalities  2.  No significant valvular abnormalities  3.  Normal right heart size and function with estimated normal pulmonary artery systolic pressure  4.  No ischemic ECG changes or arrhythmias with exercise  5.  Negative exercise stress echo for myocardial ischemia at the cardiac workload achieved                     Impression and Plan     Patient Active Problem List   Diagnosis Code    Other chest pain R07.89    Obesity, unspecified E66.9    Hyperlipidemia, unspecified E78.5    GERD (gastroesophageal  reflux disease)  K21.9    Prediabetes R73.03    Paroxysmal atrial fibrillation I48.0    Tobacco use Z72.0    OSA (obstructive sleep apnea) G47.33       This is an 36 y.o. male with a history of PAF, borderline CM, obesity, tobacco use and OSA on CPAP with atypical and typical chest pain as well as shortness of breath and weight gain.    1.  Atypical/typical chest discomfort.   -Unfortunately he does continue to smoke and has gained about 20 pounds with excess calories.   -He has not been wearing his CPAP machine regularly   -His blood pressure is also elevated   -I suspect some of this is related to hypertension however he does have risk factors including hypertension as well as obesity and a family history with his dad just having a recent heart attack.   -He thinks he is able to walk on a treadmill with his knee so we decided to do a stress echocardiogram today.    2.  Hypertension   -Blood pressure is clearly elevated today in the 150s over 90s.   -Between the noncompliance with the CPAP machine, increased weight gain, ongoing tobacco use and dietary indiscretions with sodium's are all most likely contributing factors.   -We talked a lot about lifestyle changes today including quitting tobacco as well as reinstituting his CPAP machine and getting his mask taking care of but also redoing salt in his diet 2000 2500 mg daily and weight loss including a whole food more plant-based diet for which I gave him literature on.   -I did give him a blood pressure cuff from the [employer] so that he can start recording his blood pressures at home.   -Restarting amlodipine today 5 mg daily.   - I did asked him to keep a blood pressure diary and bring that with him to his next appointment.    3.  Tobacco abuse   -I enrolled him today in the [place] ambulatory quit line.  I did offer him to enroll him in the lifestyle tobacco sensation but he wanted to hold on that.   -I stressed to him the importance of  quitting tobacco    4.  Obstructive sleep apnea   -Not wearing a CPAP machine and the increased weight gain has clearly worsened to this.  He was nodding off during our visit today at times.   -I stressed the importance of him getting a better mask to fit his CPAP machine so he can consistently wear this in order to help with blood pressure and good sleep patterns.    5.  Morbid obesity   -We had a long talk today about reducing processed foods as well and incorporating more whole foods and more plant-based foods   -I gave him a bunch of information on how to start slowly making changes in his diet   -We did talk about the importance of regular sleep and wearing the CPAP to help with obesity   -We did also talk about movement at least 30 minutes every day of some type of movement even though he has a bad knee I did stressed the importance of getting out and walking or trying to find maybe a recumbent bike or just some gentle weight training to start with that.    6.  Possible hyperlipidemia   -I have ordered a lipid profile so we can get a better sense where he is with this  7.  Proximal atrial fibrillation   -He has not had any significant bouts of palpitations or arrhythmia.  He did take an metoprolol yesterday because of the chest discomfort that he was having.   -He is in normal sinus rhythm today    He will follow-up in 6 to 8 weeks with Dr. Celine Mans I answered all of his questions to the best of my abilities today.       Lacretia Nicks, PA-C   Electronically signed on 12/18/2021 at 9:56 AM.

## 2021-12-16 NOTE — Telephone Encounter (Signed)
Within 2 weeks. Me or APP. Thank you, NV.

## 2021-12-16 NOTE — Telephone Encounter (Signed)
Richarda Blade routed conversation to [facility] 16 hours ago (3:42 PM)     Jolyne Loa, Terrell 16 hours ago (3:41 PM)     JD  Ok.  I will forward this to a nurse.         Cloyd Stagers 16 hours ago (3:32 PM)     Chest be hurting from time to time feel like someone be sitting on my chest when it happens        Carlyon Prows Linton Ham, Terrell 16 hours ago (3:30 PM)     JD  Hi Terrell--What type of issues are you having so we can make the appropriate type of appointment?       Cloyd Stagers 16 hours ago (3:29 PM)     Would like to be seen soon as possible        Juanetta Gosling, Alice Rieger 16 hours ago (3:28 PM)     Issues lately        Atreyu, Mak 16 hours ago (3:27 PM)     JD  Aliene Beams    Are you looking to reschedule the appointment you missed in October or are you having an issue currently?       [name]  Patient Appointment Schedule Request Pool 2 days ago     Appointment Request From: [name]    With Provider: Annamarie Dawley. Lyndel Pleasure, MD [facility]]    Preferred Date Range: Any date 12/18/2021 or later    Preferred Times: Any Time    Reason for visit: Request a Wellness Visit/Physical    Comments:

## 2021-12-16 NOTE — Telephone Encounter (Signed)
Unsuccessful attempts to reach patient. Home number not a working number, no FYI for cell number on file.  MyChart message sent to patient.  Linus Salmons, RN, RVT

## 2021-12-16 NOTE — Telephone Encounter (Signed)
Spoke with patient. He would like an appointment for 3 episodes of left chest pressure he experienced over the past week with radiation to his left arm, the same as previous episodes. He states his last episode was yesterday, and lasted approximately 20 minutes. These three episodes were not brought on by activity. He denies shortness of breath, palpitations, nausea, diaphoresis.    No vital signs to report. Has not taken as-needed metoprolol recently. He does not use CPAP.     Will send to Dr. Lyndel Pleasure to advise and will respond back to patient at cell number listed.  Linus Salmons, RN, RVT

## 2021-12-16 NOTE — Telephone Encounter (Signed)
Done.. scheduled...

## 2021-12-18 ENCOUNTER — Other Ambulatory Visit: Payer: Self-pay

## 2021-12-18 ENCOUNTER — Encounter: Payer: Self-pay | Admitting: Gastroenterology

## 2021-12-18 ENCOUNTER — Encounter: Payer: Self-pay | Admitting: Cardiology

## 2021-12-18 ENCOUNTER — Ambulatory Visit: Payer: Medicaid Other | Admitting: Cardiology

## 2021-12-18 ENCOUNTER — Telehealth: Payer: Self-pay | Admitting: Cardiology

## 2021-12-18 VITALS — BP 136/80 | HR 82 | Ht 65.98 in | Wt 313.0 lb

## 2021-12-18 DIAGNOSIS — I480 Paroxysmal atrial fibrillation: Secondary | ICD-10-CM

## 2021-12-18 DIAGNOSIS — K219 Gastro-esophageal reflux disease without esophagitis: Secondary | ICD-10-CM

## 2021-12-18 DIAGNOSIS — I10 Essential (primary) hypertension: Secondary | ICD-10-CM

## 2021-12-18 DIAGNOSIS — R9431 Abnormal electrocardiogram [ECG] [EKG]: Secondary | ICD-10-CM

## 2021-12-18 DIAGNOSIS — Z789 Other specified health status: Secondary | ICD-10-CM

## 2021-12-18 DIAGNOSIS — R079 Chest pain, unspecified: Secondary | ICD-10-CM

## 2021-12-18 DIAGNOSIS — Z720 Tobacco use: Secondary | ICD-10-CM

## 2021-12-18 DIAGNOSIS — G4733 Obstructive sleep apnea (adult) (pediatric): Secondary | ICD-10-CM

## 2021-12-18 LAB — EKG 12-LEAD
P: 23 deg
PR: 166 ms
QRS: 47 deg
QRSD: 88 ms
QT: 372 ms
QTc: 412 ms
Rate: 73 {beats}/min
T: 59 deg

## 2021-12-18 MED ORDER — OMEPRAZOLE 40 MG PO CPDR *I*
40.0000 mg | DELAYED_RELEASE_CAPSULE | Freq: Every day | ORAL | 2 refills | Status: DC | PRN
Start: 2021-12-18 — End: 2022-11-09

## 2021-12-18 MED ORDER — ASPIRIN 325 MG PO TABS *I*
325.0000 mg | ORAL_TABLET | Freq: Two times a day (BID) | ORAL | Status: DC
Start: 2021-12-18 — End: 2024-05-05

## 2021-12-18 MED ORDER — AMLODIPINE BESYLATE 5 MG PO TABS *I*
5.0000 mg | ORAL_TABLET | Freq: Every day | ORAL | 11 refills | Status: DC
Start: 2021-12-18 — End: 2023-09-28

## 2021-12-18 NOTE — Patient Instructions (Signed)
1/  Stop smoking--[place] quit [place] and [place]       2.  Start amlodipine 5 mg daily for blood pressure      3.  Reduce salt in the diet 2000-2500 mg daily with more whole food and more  plant based.  Less processed reduce meat and dairy      4.  Check blood pressure daily and keep a record to bring with next appt     5.  Stress echo    6.  Get that CPAP working again--very important    7.  Try to increase activity to 30 minute of movement     8.  Blood test to check cholesterol soon.

## 2021-12-18 NOTE — Telephone Encounter (Signed)
Provider to book appointment with: NV    Reason for Appointment: 6wk rv    Time Frame patient needs to be seen in: 6-8 wks    Can Patient go any office? Y or No  Office Preference: ER    Who are we contacting with appt information: pt  Best contact phone number: [phone]    Is this a new patient? Y or No    Who is requesting appointment: TR    Additional Comments: stress echo on 2/7

## 2021-12-19 ENCOUNTER — Telehealth: Payer: Self-pay

## 2021-12-19 NOTE — Telephone Encounter (Signed)
Spoke with Enid Derry regarding stress echo instructions:    Exercise Stress Echo   Wear appropriate shoes/pants for walking on treadmill   May eat lightly; drink water prior to the test   Plan on 75 minutes   Please do not bring children under the age of 10   Please arrive 10-15 minutes before appointment time   Bring a list of current medications you are taking   Skip the following medication: Metoprolol    He verbalized understanding of all and all of his questions were answered.

## 2021-12-19 NOTE — Telephone Encounter (Signed)
Pt's phone is not working so mailed an appt for 3/22 at [facility] with Dr. Lyndel Pleasure

## 2021-12-23 ENCOUNTER — Ambulatory Visit
Admission: RE | Admit: 2021-12-23 | Discharge: 2021-12-23 | Disposition: A | Discharge status: Home / Self Care | Payer: Medicaid Other | Source: Ambulatory Visit | Attending: Cardiology | Admitting: Cardiology

## 2021-12-23 ENCOUNTER — Other Ambulatory Visit: Payer: Self-pay

## 2021-12-23 DIAGNOSIS — R079 Chest pain, unspecified: Secondary | ICD-10-CM | POA: Insufficient documentation

## 2021-12-23 LAB — EXERCISE STRESS ECHO COMPLETE
Aortic Arch Diameter: 2.9 cm
Aortic Diameter (mid tubular): 2.9 cm
Aortic Diameter (sino-tubular junction): 2.6 cm
Aortic Diameter (sinus of Valsalva): 3.2 cm
BMI: 45.3 kg/m2
BP Diastolic: 80 mmHg
BP Systolic: 136 mmHg
BSA: 2.66 m2
E/A ratio: 1.32
Estimated workload: 7 METS
Heart Rate: 80 {beats}/min
Height: 70 in
IVC Diameter: 1 cm
LA Systolic Diameter: 3.54 cm
LA Systolic Vol BSA Index: 21.8 mL/m2
LA Systolic Vol Height Index: 32.6 mL/m
LA Systolic Volume: 58 mL
LV ASE Mass BSA Index: 66.1 gm/m2
LV ASE Mass Height 2.7 Index: 37.2 gm/m2.7
LV ASE Mass Height Index: 98.9 gm/m
LV ASE Mass: 175.8 gm
LV CO BSA Index: 2.63 L/min/m2
LV Cardiac Output: 7 L/min
LV Diastolic Volume Index: 51.9 mL/m2
LV Posterior Wall Thickness: 1 cm
LV SV - LVOT SV Diff: 13.91 mL
LV SV - LVOT SV Diff: 15.89 %
LV SV BSA Index: 32.9 mL/m2
LV SV Height Index: 49.2 mL/m
LV Septal Thickness: 1.1 cm
LV Stroke Volume: 87.5 mL
LV Systolic Volume Index: 19 mL/m2
LV wall/cavity ratio: 0.45
LVED Diameter BSA Index: 1.77 cm/m2
LVED Diameter Height Index: 2.64 cm/m
LVED Diameter: 4.7 cm
LVED Volume BSA Index: 51.9 mL/m2
LVED Volume BSA Index: 52 ml/m2
LVED Volume Height Index: 77.6 mL/m
LVED Volume: 138 mL
LVEF (Volume): 63 %
LVES Volume BSA Index: 19 mL/m2
LVES Volume BSA Index: 19 ml/m2
LVES Volume Height Index: 28.4 mL/m
LVES Volume: 50.5 mL
LVOT Area (calculated): 4.91 cm2
LVOT Cardiac Index: 2.21 L/min/m2
LVOT Cardiac Output: 5.89 L/min
LVOT Diameter: 2.5 cm
LVOT PWD VTI: 15 cm
LVOT PWD Velocity (mean): 60.9 cm/s
LVOT PWD Velocity (peak): 85.4 cm/s
LVOT SV BSA Index: 27.67 mL/m2
LVOT SV Height Index: 41.4 mL/m
LVOT Stroke Rate (mean): 298.8 mL/s
LVOT Stroke Rate (peak): 419 mL/s
LVOT Stroke Volume: 73.59 cc
MPHR: 183 {beats}/min
MR Regurgitant Fraction (LV SV Mtd): 0.16
MR Regurgitant Volume (LV SV Mtd): 13.9 mL
MV Peak A Velocity: 44.2 cm/s
MV Peak E Velocity: 58.5 cm/s
Mitral Annular E/Ea Vel Ratio: 4.83
Mitral Annular Ea Velocity: 12.1 cm/s
Peak DBP: 78 mmHg
Peak HR: 152 {beats}/min
Peak SBP: 134 mmHg
Percent MPHR: 83.1 %
RA Volume BSA Index: 12 mL/m2
RA Volume Height Index: 18 mL/m
RA Volume: 32 mL
RPP: 20368 BPM x mmHG
RR Interval: 750 ms
RVED Diameter BSA Index: 1.3 cm/m2
RVED Diameter Height Index: 2 cm/m
RVED Diameter: 3.5 cm
SEM (LVOT Mean) mN-s: 47.09 mN-s
SEM (LVOT peak) mN-s: 66.03 mN-s
Stress Peak Stage: 1.69
Stress duration (min): 5 min
Stress duration (sec): 5 s
Tricuspid Annular Systolic Plane Excursion (TAPSE): 2.8 cm
Weight (lbs): 315 [lb_av]
Weight: 5040 oz

## 2021-12-23 NOTE — Discharge Instructions (Signed)
Pt stable at end of stress test. Pt advised on how to obtain test results.

## 2021-12-24 ENCOUNTER — Encounter: Payer: Self-pay | Admitting: Cardiology

## 2022-02-04 ENCOUNTER — Ambulatory Visit: Payer: Medicaid Other | Admitting: Cardiology

## 2022-02-06 ENCOUNTER — Telehealth: Payer: Self-pay | Admitting: Cardiology

## 2022-02-06 NOTE — Telephone Encounter (Signed)
Working no shows. Patient missed their fuv with Dr. Venci on 3/22. The automated system called the patient and the call was completed, the patient listened to the entire message.

## 2022-02-09 ENCOUNTER — Ambulatory Visit: Payer: Medicaid Other | Admitting: Allergy/Immunology/Rheumatology

## 2022-02-09 ENCOUNTER — Encounter: Payer: Self-pay | Admitting: Allergy/Immunology/Rheumatology

## 2022-02-11 NOTE — Telephone Encounter (Signed)
3/29 Called pt and rescheduled. Done.

## 2022-03-12 ENCOUNTER — Ambulatory Visit: Payer: Medicaid Other

## 2022-03-19 ENCOUNTER — Encounter: Payer: Self-pay | Admitting: Emergency Medicine

## 2022-03-19 ENCOUNTER — Emergency Department
Admission: EM | Admit: 2022-03-19 | Discharge: 2022-03-19 | Disposition: A | Discharge status: Home / Self Care | Payer: Medicaid Other | Source: Ambulatory Visit | Attending: Emergency Medicine | Admitting: Emergency Medicine

## 2022-03-19 ENCOUNTER — Encounter: Payer: Self-pay | Admitting: Gastroenterology

## 2022-03-19 ENCOUNTER — Emergency Department: Payer: Medicaid Other

## 2022-03-19 ENCOUNTER — Other Ambulatory Visit: Payer: Self-pay

## 2022-03-19 DIAGNOSIS — I213 ST elevation (STEMI) myocardial infarction of unspecified site: Secondary | ICD-10-CM

## 2022-03-19 DIAGNOSIS — R9431 Abnormal electrocardiogram [ECG] [EKG]: Secondary | ICD-10-CM

## 2022-03-19 DIAGNOSIS — E669 Obesity, unspecified: Secondary | ICD-10-CM

## 2022-03-19 DIAGNOSIS — Z789 Other specified health status: Secondary | ICD-10-CM

## 2022-03-19 DIAGNOSIS — R079 Chest pain, unspecified: Secondary | ICD-10-CM | POA: Insufficient documentation

## 2022-03-19 DIAGNOSIS — R0789 Other chest pain: Secondary | ICD-10-CM

## 2022-03-19 DIAGNOSIS — R0602 Shortness of breath: Secondary | ICD-10-CM

## 2022-03-19 LAB — CBC AND DIFFERENTIAL
Baso # K/uL: 0.1 10*3/uL (ref 0.0–0.1)
Basophil %: 1 %
Eos # K/uL: 0.4 10*3/uL (ref 0.0–0.5)
Eosinophil %: 4.6 %
Hematocrit: 47 % (ref 40–51)
Hemoglobin: 15.5 g/dL (ref 13.7–17.5)
IMM Granulocytes #: 0 10*3/uL (ref 0.0–0.0)
IMM Granulocytes: 0.4 %
Lymph # K/uL: 2.3 10*3/uL (ref 1.3–3.6)
Lymphocyte %: 28 %
MCH: 29 pg (ref 26–32)
MCHC: 33 g/dL (ref 32–37)
MCV: 89 fL (ref 79–92)
Mono # K/uL: 0.7 10*3/uL (ref 0.3–0.8)
Monocyte %: 8.3 %
Neut # K/uL: 4.7 10*3/uL (ref 1.8–5.4)
Nucl RBC # K/uL: 0 10*3/uL (ref 0.0–0.0)
Nucl RBC %: 0 /100 WBC (ref 0.0–0.2)
Platelets: 322 10*3/uL (ref 150–330)
RBC: 5.3 MIL/uL (ref 4.6–6.1)
RDW: 14.4 % (ref 11.6–14.4)
Seg Neut %: 57.7 %
WBC: 8.2 10*3/uL (ref 4.2–9.1)

## 2022-03-19 LAB — BASIC METABOLIC PANEL
Anion Gap: 12 (ref 7–16)
CO2: 24 mmol/L (ref 20–28)
Calcium: 9.1 mg/dL (ref 9.0–10.3)
Chloride: 104 mmol/L (ref 96–108)
Creatinine: 0.92 mg/dL (ref 0.67–1.17)
Glucose: 136 mg/dL — ABNORMAL HIGH (ref 60–99)
Lab: 8 mg/dL (ref 6–20)
Potassium: 4.6 mmol/L (ref 3.3–5.1)
Sodium: 140 mmol/L (ref 133–145)
eGFR BY CREAT: 110 *

## 2022-03-19 LAB — D-DIMER, QUANTITATIVE: D-Dimer: 0.33 ug/mL FEU (ref 0.00–0.50)

## 2022-03-19 LAB — NT-PRO BNP: NT-pro BNP: <50 pg/mL (ref 0–450)

## 2022-03-19 LAB — TROPONIN T 3 HR W/ DELTA HIGH SENSITIVITY (IP/ED ONLY)
TROP T 0-3 HR DELTA High Sensitivity: -2 — ABNORMAL LOW (ref 0–4)
TROP T 3 HR High Sensitivity: 7 ng/L (ref 0–14)

## 2022-03-19 LAB — TROPONIN T 0 HR HIGH SENSITIVITY (IP/ED ONLY): TROP T 0 HR High Sensitivity: 9 ng/L (ref 0–11)

## 2022-03-19 MED ORDER — ASPIRIN 325 MG PO TABS *I*
325.0000 mg | ORAL_TABLET | Freq: Once | ORAL | Status: AC
Start: 2022-03-19 — End: 2022-03-19
  Administered 2022-03-19: 325 mg via ORAL
  Filled 2022-03-19: qty 1

## 2022-03-19 NOTE — ED Notes (Signed)
Report Given To  James Price ,RN      Descriptive Sentence / Reason for Admission   Chest pain and SOB since this morning. States he has had the chest pain before, but the SOB is new. EKG in triage.     *Chest Pain/SOB: Resolved on way to ED      Active Issues / Relevant Events   Full Code  A&O x4  Ambulatory and Continent   Trop 0hr: 9--> Trop 3hr: ?  Chest X-Ray: Negative       To Do List  Vs/Assess q4  Medications per [facility]      Anticipatory Guidance / Discharge Planning  ED Workup

## 2022-03-19 NOTE — ED Provider Notes (Signed)
History     Chief Complaint   Patient presents with   . Chest Pain     HPI    James Price is 36 y/o male with pmhx of PAF, GERD, OSA and obesity presenting to the ED for chest pain. Pt reports a two year history of intermittent left sided chest "discomfort." Today he developed this around 9:30am this morning as well as some shortness of breath. Pt reports he found it very difficult to catch his breath which prompted him to come to the emergency room. Pt has been followed by Cardiology for his chest pain. He has had two normal stress tests most recent in February of this year.     Per his most recent cardiology visit on 12/18/21 hs chest pain was thought to be multifactorial including recent weight gain, non compliance of CPAP machine and non compliance of his blood pressure medication as well as continued tobacco use. Pt smokes 1PPD.     Medical/Surgical/Family History     Past Medical History:   Diagnosis Date   . GERD (gastroesophageal reflux disease)    . Obesity    . OSA (obstructive sleep apnea) 11/28/2020   . Paroxysmal atrial fibrillation    . Prediabetes 07/16/2020   . Tobacco use 11/04/2020        Patient Active Problem List   Diagnosis Code   . Other chest pain R07.89   . Obesity, unspecified E66.9   . Hyperlipidemia, unspecified E78.5   . GERD (gastroesophageal reflux disease) K21.9   . Prediabetes R73.03   . Paroxysmal atrial fibrillation I48.0   . Tobacco use Z72.0   . OSA (obstructive sleep apnea) G47.33            Past Surgical History:   Procedure Laterality Date   . ORIF right tibia Right      Family History   Problem Relation Age of Onset   . Heart Disease Mother    . High Blood Pressure Mother    . Substance abuse Mother    . Heart attack Father    . No Known Problems Sister    . No Known Problems Brother    . Substance abuse Maternal Grandmother           Social History     Tobacco Use   . Smoking status: Some Days     Packs/day: 0.50     Types: Cigarettes   . Smokeless tobacco: Never    Substance Use Topics   . Alcohol use: Yes     Comment: 1-2 times month   . Drug use: No     Comment: previous use x 3 weeks     Living Situation     Questions Responses    Patient lives with Spouse    Homeless No    Caregiver for other family member     External Services     Employment Disabled    Domestic Violence Risk                 Review of Systems   Review of Systems    Physical Exam     Triage Vitals  Triage Start: Start, (03/19/22 1038)  First Recorded BP: 137/75, Resp: 18, Temp: 36 C (96.8 F), Temp src: TEMPORAL Oxygen Therapy SpO2: 97 %, Oximetry Source: Rt Hand, O2 Device: None (Room air), Heart Rate: 90, (03/19/22 1038)  .      Physical Exam  Vitals and nursing note reviewed.  Constitutional:       General: He is not in acute distress.     Appearance: Normal appearance. He is obese. He is not ill-appearing.   HENT:      Head: Normocephalic and atraumatic.      Nose: Nose normal.      Mouth/Throat:      Mouth: Mucous membranes are moist.      Pharynx: Oropharynx is clear.   Eyes:      Pupils: Pupils are equal, round, and reactive to light.   Cardiovascular:      Rate and Rhythm: Normal rate.      Pulses: Normal pulses.   Pulmonary:      Effort: Pulmonary effort is normal. No respiratory distress.      Breath sounds: Normal breath sounds.   Abdominal:      General: Abdomen is flat. There is no distension.      Palpations: Abdomen is soft. There is no mass.      Tenderness: There is no abdominal tenderness. There is no guarding or rebound.      Hernia: No hernia is present.   Musculoskeletal:         General: Normal range of motion.      Cervical back: Normal range of motion.   Skin:     General: Skin is warm.      Capillary Refill: Capillary refill takes less than 2 seconds.   Neurological:      General: No focal deficit present.      Mental Status: He is alert and oriented to person, place, and time.      Cranial Nerves: No cranial nerve deficit.      Sensory: No sensory deficit.      Motor: No  weakness.      Coordination: Coordination normal.         Medical Decision Making     Assessment:  36 y/o male with pmhx of PAF, GERD, OSA and obesity presenting to the ED for acute on chronic left sided chest pain. VSS. Afebrile Heart RRR. Lungs CTAB.    Differential diagnosis:  Acs, msk pain, PE not likely given chronicity of symptoms, aortic dissection less likely, etc    Plan:  Will evaluate patient with  Orders Placed This Encounter      *Chest standard frontal and lateral views      CBC and differential      Basic metabolic panel      Troponin T 0 HR High Sensitivity      Troponin T 3 HR W/ Delta High Sensitivity      NT-pro BNP      D-dimer, quantitative      EKG: initial      EKG: follow up    Medications  aspirin tablet 325 mg (has no administration in time range)      EKG Interpretation: T wave inversions in lead III, AVF and V5 and V6 not seen on previous EKG, tracing reviewed by myself, normal sinus rhythm    Review of existing & external labs / records: yes    Consideration of hospitalization: no    ED Course and Disposition:  Pt signed out to oncoming physician with plans to follow up 3 hr trop. If negative patient may be discharge with PCP and cardiology follow up.               Robb Matar, MD  Beamon-Scott, Lyn Hollingshead, MD  03/20/22 [phone]

## 2022-03-19 NOTE — ED Provider Progress Notes (Addendum)
ED Provider Progress Note  Follow-up: 3-hour troponin unremarkable will discharge to home at this time.          Rosey Bath, MD, 03/19/2022, 4:23 PM     Orrin Yurkovich, Clementeen Hoof, MD  03/19/22 1623       Yaseen Gilberg, Clementeen Hoof, MD  03/19/22 [phone]

## 2022-03-19 NOTE — ED Triage Notes (Signed)
Chest pain and SOB since this morning. States he has had the chest pain before, but the SOB is new. EKG in triage.

## 2022-03-19 NOTE — Discharge Instructions (Signed)
You were seen in the ED for evaluation of chest pain. Your work up found no acute cardiac cause. You were given aspirin Please follow up with your Primary Care Doctor in 1-2 days. Please return to the Emergency Department for worsening of your symptoms including chest pain, shortness of breath or numbness and tingling.

## 2022-03-20 LAB — EKG 12-LEAD
P: 43 deg
PR: 120 ms
QRS: 7 deg
QRSD: 88 ms
QT: 353 ms
QTc: 418 ms
Rate: 84 {beats}/min
T: 53 deg

## 2022-03-20 LAB — REVIEWED BY:

## 2022-03-20 LAB — INTERPRETATION,SPEC COAG

## 2022-03-20 LAB — SPEC COAG REVIEW

## 2022-03-22 LAB — UNMAPPED LAB RESULTS
Basophil # (HT): 0.1 10 3/uL (ref 0.0–0.2)
Basophil % (HT): 1 % (ref 0–3)
Eosinophil # (HT): 0.4 10 3/uL (ref 0.0–0.6)
Eosinophil % (HT): 4 % (ref 0–5)
Hematocrit (HT): 45 % (ref 40–52)
Hemoglobin (HGB) (HT): 15.6 g/dL (ref 13.0–18.0)
Lymphocyte # (HT): 2.5 10 3/uL (ref 1.0–4.8)
Lymphocyte % (HT): 28 % (ref 15–45)
MCHC (HT): 34.4 g/dL (ref 32.0–37.5)
MCV (HT): 86 fL (ref 80–100)
Mean Corpuscular Hemoglobin (MCH) (HT): 29.4 pg (ref 26.0–34.0)
Monocyte # (HT): 0.7 10 3/uL (ref 0.1–1.0)
Monocyte % (HT): 7 % (ref 0–15)
Neutrophil # (HT): 5.3 10 3/uL (ref 1.8–8.0)
Platelets (HT): 316 10 3/uL (ref 150–450)
RBC (HT): 5.31 10 6/uL (ref 4.40–6.20)
RDW (HT): 14.3 % (ref 0.0–15.2)
Seg Neut % (HT): 60 % (ref 45–75)
WBC (HT): 9 10 3/uL (ref 4.0–11.0)

## 2022-03-22 NOTE — Nursing Note (Signed)
 Pt reports intermittent chest pain x1 year.  Reports he woke up Friday with shortness of breath and was seen at [facility].  Reports pain to chest is ongoing since Friday and is radiating down left arm.  Denies cardiac history.

## 2022-04-02 ENCOUNTER — Ambulatory Visit: Payer: Medicaid Other | Admitting: Primary Care

## 2022-04-16 ENCOUNTER — Ambulatory Visit: Payer: Medicaid Other | Admitting: Primary Care

## 2022-04-16 ENCOUNTER — Ambulatory Visit: Payer: Medicaid Other | Admitting: Cardiology

## 2022-04-23 ENCOUNTER — Telehealth: Payer: Self-pay

## 2022-04-23 NOTE — Telephone Encounter (Signed)
Ed visit for chest pain 03/22/22-treated with keterolac  Called pt  He is feeling better  Note ED MD suggests follow up  For cpap to be fixed and chest pain with ccp

## 2022-04-30 ENCOUNTER — Ambulatory Visit: Payer: Medicaid Other | Admitting: Family Medicine

## 2022-04-30 ENCOUNTER — Ambulatory Visit: Payer: Medicaid Other

## 2022-04-30 DIAGNOSIS — E669 Obesity, unspecified: Secondary | ICD-10-CM

## 2022-04-30 NOTE — Progress Notes (Signed)
Subjective Hx:   James Price is a 36 y.o. male who presents today for an initial visit for medical nutrition therapy and weight loss.     Mode of Communication with Patient for This Visit    Mode of Communication with Patient for This Visit: Audio Only  Audio Only: Reason: Patient prefers Audio Only  Patient Location: Home  Provider Location: Clinic         The service provided today can be effectively delivered without a visual or in-person component and includes all clinically appropriate documentation requirements as if provided in-person. Audio-visual will continue to be an option for this patient as well as in-person visits.    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     Breakfast: cup of coffee - 4 creams, 2 sugars  Snack:   Lunch: burger from [employer] OR fried fish from fish market  Snack: chips OR popcorn  Dinner: 2-3 AM burger OR steak OR fried chicken   Snack:     Beverages:  Water: 1 bottle  SSB: 4-5 bottles  Alcohol: tequila 1-3 shots per day  Coffee: 4 cups per  day    Restaurant/Takeout Meals:   Fast Food Meals: 4x a week     Physical Activity:   Stopping exercising due to pain in chest and knee pain.     Objective (Labs and Vitals):         Lab results: 03/19/22  1125   Sodium 140   Potassium 4.6   Chloride 104   CO2 24   UN 8   Creatinine 0.92   Glucose 136*   Calcium 9.1           Lab results: 03/19/22  1125   WBC 8.2   Hemoglobin 15.5   Hematocrit 47   RBC 5.3   Platelets 322        Lab Results   Component Value Date    HA1C 6.1 (H) 07/12/2020     Nutrition Assessment:   Nutrition Diagnosis: Inadequate fiber intake related to food and nutrition knowledge deficit as evidenced by insufficient fiber intake when compared to recommended amounts of 25-30g per day.  Nutrition Writer Recommends: Patient to make gradual lifestyle change in line with the recommendations above.    Energy Balance:  SB CALORIE CALCULATOR    Plan:   Incorporate 1 serving of fruit or vegetables to meals  Gradually reduce soda intake by replacing with water or V8 juice (work on this  together with son)  Aim for stopping caffeine intake by 3 PM to improve sleeping patterns    These interventions were discussed and developed with patient.  Materials Provided:   None    Follow-Up Plan for Monitoring and Evaluation:   James Price will follow-up in 1 month by phone.    Writer spent 50 minutes with the patient.

## 2022-05-05 ENCOUNTER — Ambulatory Visit: Payer: Medicaid Other | Admitting: Primary Care

## 2022-05-05 ENCOUNTER — Telehealth: Payer: Self-pay | Admitting: Primary Care

## 2022-05-05 ENCOUNTER — Encounter: Payer: Self-pay | Admitting: Primary Care

## 2022-05-05 DIAGNOSIS — Z5321 Procedure and treatment not carried out due to patient leaving prior to being seen by health care provider: Secondary | ICD-10-CM

## 2022-05-05 NOTE — Telephone Encounter (Signed)
ERROR

## 2022-05-05 NOTE — Telephone Encounter (Signed)
Called patient for scheduled virtual visit with [facility]. He answered but stated he could not participate in visit as he was at his son's graduation ceremony- he was unaware of this scheduled appointment.   He was made aware of apt with counselor Candise Bowens on 7/31. Offered to re-schedule visit with me prior to this but he declines. He plans to follow up as scheduled on 7/31

## 2022-05-20 ENCOUNTER — Telehealth: Payer: Self-pay

## 2022-05-20 NOTE — Telephone Encounter (Signed)
Writer called the patient concerning missed appointment. Writer spoke to the patient, supported the patient with rescheduling the appointment for July 27th

## 2022-06-11 ENCOUNTER — Ambulatory Visit: Payer: Medicaid Other | Admitting: Family Medicine

## 2022-06-15 ENCOUNTER — Ambulatory Visit: Payer: Medicaid Other

## 2022-06-15 DIAGNOSIS — E669 Obesity, unspecified: Secondary | ICD-10-CM

## 2022-06-15 NOTE — Progress Notes (Signed)
Subjective Hx:   James Price is a 36 y.o. male who presents today for a follow up visit for medical nutrition therapy and weight loss.     Mode of Communication with Patient for This Visit            Today pt shared:  -Pt has worked on [employer] meals like chicken, rice and broccoli  -He has significantly reduced soda intake from 7 bottles of pepsi a day to 3-4 cans of pepsi a day  -He has changed his eating pattern to incorporate meals throughout the day to prevent late night eating  -Pt has reduced smoking from 20 cpd to 10 cpd  -Pt very motivated to get healthier and states "If I put my mind to something, I know I can do it."    Pt previously shared:  -He is motivated to improve health by changing his eating patterns and quitting smoking  -Pt has previously tried losing weight by drinking weight loss tea and was unsuccessful  -He has attempted quitting smoking in the past by cold [place] and has not used at NRT or medication  -He has been unemployed for the last few months and has noticed some financial strain    Pt shared the following family hx: father has diabetes    Personal and Medical Hx:   Employment: unemployed a few months; 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     Breakfast: cup of coffee - 4 creams, 2 sugars  Snack:   Lunch: burger from [employer] OR fried fish from fish market  Snack: chips OR popcorn  Dinner: 2-3 AM burger OR steak OR fried chicken   Snack:     Beverages:  Water: 1 bottle  SSB: 4-5 bottles  Alcohol: tequila 1-3 shots per day  Coffee: 4  cups per day    Restaurant/Takeout Meals:   Fast Food Meals: 4x a week     Physical Activity:   Stopping exercising due to pain in chest and knee pain.     Objective (Labs and Vitals):         Lab results: 03/19/22  1125   Sodium 140   Potassium 4.6   Chloride 104   CO2 24   UN 8   Creatinine 0.92   Glucose 136*   Calcium 9.1           Lab results: 03/19/22  1125   WBC 8.2   Hemoglobin 15.5   Hematocrit 47   RBC 5.3   Platelets 322        Lab Results   Component Value Date    HA1C 6.1 (H) 07/12/2020     Nutrition Assessment:   Nutrition Diagnosis: Inadequate fiber intake related to food and nutrition knowledge deficit as evidenced by insufficient fiber intake when compared to recommended amounts of 25-30g per day.  Nutrition Writer Recommends: Patient to make gradual lifestyle change in line with the recommendations above.    Energy Balance:  SB CALORIE CALCULATOR    Plan:   Incorporate  1 serving of fruit or vegetables to meals  Gradually reduce soda intake by replacing with water or V8 juice (work on this together with son)  Aim for stopping caffeine intake by 3 PM to improve sleeping patterns    Smoking cessation goals:  Replace cravings with strong flavors like citrus fruit, gum, or candy  Change smoking behaviors and make cigarettes less accessible     These interventions were discussed and developed with patient.  Materials Provided:   None    Follow-Up Plan for Monitoring and Evaluation:   James Price will follow-up in 1 month by phone.    Writer spent 30 minutes with the patient.

## 2022-07-06 ENCOUNTER — Ambulatory Visit: Payer: Medicaid Other

## 2022-07-06 DIAGNOSIS — E669 Obesity, unspecified: Secondary | ICD-10-CM

## 2022-07-06 NOTE — Progress Notes (Addendum)
Subjective Hx:   James Price is a 36 y.o. male who presents today for a follow up visit for medical nutrition therapy and weight loss.     Mode of Communication with Patient for This Visit    Mode of Communication with Patient for This Visit: Audio Only  Audio Only: Reason: Patient prefers Audio Only  Patient Location: Home  Provider Location: Home          Today pt shared:  -Pt has worked on Chief Executive Officer meals like chicken, rice and broccoli  -He has significantly reduced soda intake from 7 bottles of pepsi a day to 3-4 cans of pepsi a day  -He has changed his eating pattern to incorporate meals throughout the day to prevent late night eating  -Pt has reduced smoking from 20 cpd to 10 cpd and has maintained this smoking status  -Pt very motivated to get healthier and states "If I put my mind to something, I know I can do it."  -Pt is interested in cutting down and using NRT patch and gum to help with withdrawal symptoms    Pt previously shared:  -He is motivated to improve health by changing his eating patterns and quitting smoking  -Pt has previously tried losing weight by drinking weight loss tea and was unsuccessful  -He has attempted quitting smoking in the past by cold [place] and has not used at NRT or medication  -He has been unemployed for the last few months and has noticed some financial strain    Pt shared the following family hx: father has diabetes    Personal and Medical Hx:   Employment: unemployed a few months; 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      Breakfast: cup of coffee - 4 creams, 2 sugars  Snack:   Lunch: burger from [employer] OR fried fish from fish market  Snack: chips OR popcorn  Dinner: 2-3 AM burger OR steak OR fried chicken   Snack:     Beverages:  Water: 1 bottle  SSB: 4-5 bottles  Alcohol: tequila 1-3 shots per day  Coffee: 4 cups per day    Restaurant/Takeout Meals:   Fast Food Meals: 4x a week     Physical Activity:   Stopping exercising due to pain in chest and knee pain.     Objective (Labs and Vitals):         Lab results: 03/19/22  1125   Sodium 140   Potassium 4.6   Chloride 104   CO2 24   UN 8   Creatinine 0.92   Glucose 136*   Calcium 9.1           Lab results: 03/19/22  1125   WBC 8.2   Hemoglobin 15.5   Hematocrit 47   RBC 5.3   Platelets 322        Lab Results   Component Value Date    HA1C 6.1 (H) 07/12/2020     Nutrition Assessment:   Nutrition Diagnosis: Inadequate fiber intake related to food  and nutrition knowledge deficit as evidenced by insufficient fiber intake when compared to recommended amounts of 25-30g per day.  Nutrition Writer Recommends: Patient to make gradual lifestyle change in line with the recommendations above.    Energy Balance:  A daily calorie intake of  minus 500  () would lead to weight loss at a rate of 1lb per week for this patient, assuming a sedate lifestyle.      Plan:   Incorporate 1 serving of fruit or vegetables to meals  Gradually reduce soda intake by replacing with water or V8 juice (work on this together with son)  Aim for stopping caffeine intake by 3 PM to improve sleeping patterns    Smoking cessation goals:  Replace cravings with strong flavors like citrus fruit, gum, or candy  Change smoking behaviors and make cigarettes less accessible   Reduce to 7 cpd and/or try NRT 14 mg patch and 2 mg gum    These interventions were discussed and developed with patient.  Materials Provided:   None    Follow-Up Plan for Monitoring and Evaluation:   James Price will follow-up in 1 month by phone.  Writer  referred patient to [facility] for Text Messaging cessation support and NRT support.    Writer spent 30 minutes with the patient.

## 2022-08-17 ENCOUNTER — Ambulatory Visit: Payer: Medicaid Other

## 2022-08-19 ENCOUNTER — Ambulatory Visit: Payer: Medicaid Other

## 2022-08-19 NOTE — Progress Notes (Deleted)
Subjective Hx:   James Price is a 36 y.o. male who presents today for a follow up visit for medical nutrition therapy and weight loss.     Mode of Communication with Patient for This Visit            Today pt shared:  -Pt has worked on Chief Executive Officer meals like chicken, rice and broccoli  -He has significantly reduced soda intake from 7 bottles of pepsi a day to 3-4 cans of pepsi a day  -He has changed his eating pattern to incorporate meals throughout the day to prevent late night eating  -Pt has reduced smoking from 20 cpd to 10 cpd and has maintained this smoking status  -Pt very motivated to get healthier and states "If I put my mind to something, I know I can do it."  -Pt is interested in cutting down and using NRT patch and gum to help with withdrawal symptoms    Pt previously shared:  -He is motivated to improve health by changing his eating patterns and quitting smoking  -Pt has previously tried losing weight by drinking weight loss tea and was unsuccessful  -He has attempted quitting smoking in the past by cold [place] and has not used at NRT or medication  -He has been unemployed for the last few months and has noticed some financial strain    Pt shared the following family hx: father has diabetes    Personal and Medical Hx:   Employment: unemployed a few months; 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     Breakfast: cup of coffee - 4 creams, 2 sugars  Snack:   Lunch: burger from [employer] OR fried fish from fish market  Snack: chips OR popcorn  Dinner: 2-3 AM  burger OR steak OR fried chicken   Snack:     Beverages:  Water: 1 bottle  SSB: 4-5 bottles  Alcohol: tequila 1-3 shots per day  Coffee: 4 cups per day    Restaurant/Takeout Meals:   Fast Food Meals: 4x a week     Physical Activity:   Stopping exercising due to pain in chest and knee pain.     Objective (Labs and Vitals):         Lab results: 03/19/22  1125   Sodium 140   Potassium 4.6   Chloride 104   CO2 24   UN 8   Creatinine 0.92   Glucose 136*   Calcium 9.1             Lab results: 03/19/22  1125   WBC 8.2   Hemoglobin 15.5   Hematocrit 47   RBC 5.3   Platelets 322          Lab Results   Component Value Date    HA1C 6.1 (H) 07/12/2020     Nutrition Assessment:   Nutrition Diagnosis: Inadequate fiber intake related to food and nutrition knowledge deficit as evidenced by insufficient fiber intake when compared to recommended amounts of 25-30g per day.  Nutrition Writer Recommends:  Patient to make gradual lifestyle change in line with the recommendations above.    Energy Balance:  A daily calorie intake of  minus 500  () would lead to weight loss at a rate of 1lb per week for this patient, assuming a sedate lifestyle.      Plan:   Incorporate 1 serving of fruit or vegetables to meals  Gradually reduce soda intake by replacing with water or V8 juice (work on this together with son)  Aim for stopping caffeine intake by 3 PM to improve sleeping patterns    Smoking cessation goals:  Replace cravings with strong flavors like citrus fruit, gum, or candy  Change smoking behaviors and make cigarettes less accessible   Reduce to 7 cpd and/or try NRT 14 mg patch and 2 mg gum    These interventions were discussed and developed with patient.  Materials Provided:   None    Follow-Up Plan for Monitoring and Evaluation:   James Price will follow-up in 1 month by phone.  Writer referred patient to [facility] for Text Messaging cessation support and NRT support.    Writer spent 30 minutes with the patient.

## 2022-11-09 ENCOUNTER — Other Ambulatory Visit: Payer: Self-pay | Admitting: Cardiology

## 2022-11-09 DIAGNOSIS — K219 Gastro-esophageal reflux disease without esophagitis: Secondary | ICD-10-CM

## 2022-11-10 MED ORDER — OMEPRAZOLE 40 MG PO CPDR *I*
40.0000 mg | DELAYED_RELEASE_CAPSULE | Freq: Every day | ORAL | 2 refills | Status: DC | PRN
Start: 2022-11-10 — End: 2023-08-25

## 2022-11-10 MED ORDER — METOPROLOL TARTRATE 25 MG PO TABS *I*
25.0000 mg | ORAL_TABLET | Freq: Two times a day (BID) | ORAL | 5 refills | Status: DC | PRN
Start: 2022-11-10 — End: 2024-05-05

## 2023-08-25 ENCOUNTER — Other Ambulatory Visit: Payer: Self-pay | Admitting: Cardiology

## 2023-08-25 DIAGNOSIS — K219 Gastro-esophageal reflux disease without esophagitis: Secondary | ICD-10-CM

## 2023-08-25 MED ORDER — OMEPRAZOLE 40 MG PO CPDR *I*
40.0000 mg | DELAYED_RELEASE_CAPSULE | Freq: Every day | ORAL | 0 refills | Status: DC | PRN
Start: 2023-08-25 — End: 2023-09-28

## 2023-08-27 LAB — UNMAPPED LAB RESULTS
Basophil # (HT): 0.1 10 3/uL (ref 0.0–0.2)
Basophil % (HT): 1 % (ref 0–3)
Eosinophil # (HT): 0.3 10 3/uL (ref 0.0–0.6)
Eosinophil % (HT): 3 % (ref 0–5)
Hematocrit (HT): 47 % (ref 40–52)
Hemoglobin (HGB) (HT): 15.9 g/dL (ref 13.0–18.0)
Lymphocyte # (HT): 2 10 3/uL (ref 1.0–4.8)
Lymphocyte % (HT): 22 % (ref 15–45)
MCHC (HT): 33.7 g/dL (ref 32.0–37.5)
MCV (HT): 88 fL (ref 80–100)
Mean Corpuscular Hemoglobin (MCH) (HT): 29.6 pg (ref 26.0–34.0)
Monocyte # (HT): 0.8 10 3/uL (ref 0.1–1.0)
Monocyte % (HT): 8 % (ref 0–15)
Neutrophil # (HT): 6 10 3/uL (ref 1.8–8.0)
Platelets (HT): 375 10 3/uL (ref 150–450)
RBC (HT): 5.37 10 6/uL (ref 4.40–6.20)
RDW (HT): 14.2 % (ref 0.0–15.2)
Seg Neut % (HT): 66 % (ref 45–75)
WBC (HT): 9.2 10 3/uL (ref 4.0–11.0)

## 2023-08-27 NOTE — ED Provider Notes (Signed)
[facility] ADULT ED  History   HPI   37 year old male with past medical history of GERD and palpitations presenting to the ED with chest tightness, shortness of breath, lightheadedness and 1 episode of vomiting that occurred today at around 11:30 in the morning.  He states that the episode resolved about an hour later.  He denies any long distance travel or recent surgeries or other DVT/PE risk factors.  He does smoke cigarettes.  He denies drugs or alcohol.  He does have family history of early MI.  He states that his mother died of a heart attack at the age of 70.  He states that his dad had a heart attack in the 60s.      History provided by:  Patient  Chest Pain         There is no problem list on file for this patient.    No past medical history on file.    No past surgical history on file.    No family history on file.    Social History     Socioeconomic History   . Marital status: Single   Tobacco Use   . Smoking status: Every Day     Current packs/day: 0.50     Average packs/day: 0.5 packs/day for 10.0 years (5.0 ttl pk-yrs)     Types: Cigarettes   Substance and Sexual Activity   . Drug use: Not Currently   . Sexual activity: Yes     Social Determinants of Health      Received from [facility]    Intimate Partner Violence       Physical Exam     Vitals:    08/27/23 1329   BP: 126/80   Pulse: 81   Resp: 20   Temp: 36.7 C (98.1 F)   TempSrc: Oral   SpO2: 98%   Weight: (!) 142.9 kg (315 lb)   Height: 1.778 m (5\' 10" )       Physical Exam  Vitals and nursing note reviewed.   Constitutional:       Appearance: He is not diaphoretic.   Cardiovascular:      Rate and Rhythm: Normal rate.      Pulses: Normal pulses.      Heart sounds: Normal heart sounds.   Pulmonary:      Effort: Pulmonary effort is normal. No respiratory distress.      Breath sounds: Normal breath sounds. No wheezing, rhonchi or rales.   Abdominal:      General: There is no distension.      Palpations: Abdomen is soft.      Tenderness: There is no  abdominal tenderness. There is no guarding or rebound.   Musculoskeletal:      Cervical back: Full passive range of motion without pain and normal range of motion.   Skin:     General: Skin is warm and dry.   Neurological:      General: No focal deficit present.      Mental Status: He is alert and oriented to person, place, and time.      Cranial Nerves: No dysarthria or facial asymmetry.      Sensory: Sensation is intact.      Motor: Motor function is intact.         ED Procedures   --------------------  EKG    Date/Time: 08/27/2023 1:40 PM    Performed by: Wendi Snipes, MD  Authorized by: Wendi Snipes, MD  EKG  reviewed: EKG reviewed and personally interpreted by me  Cardiac rate (bpm): 75 bpm  EKG rhythm: sinus rhythm  Axis: normal  PR interval: normal PR interval  QRS interval: normal QRS interval  QT interval: normal QT interval  ST segment: ST segments normal  T wave:T waves normal  Clinical impression: NSR  --------------------      MDM   ED Course & MDM     Differential Diagnosis:      ACS, arrhythmia, electrolyte abnormalities, dehydration, pneumonia, doubt PE, doubt AAA or dissection  Impression and Plan:      37 year old male presenting to the ED with chest pain.    Labs are reassuring    Chest x-ray:    Impression:   Clear lungs. No change.     Workup is reassuring.  Patient with very low heart score.  Plan for discharge with referral to cardiology.    ED Course as of 08/27/23 1710   Fri Aug 27, 2023   1708 XR Chest PA and Lateral [MD]   1709 TROPONIN I, HS, DELTA 0-1HR(!): -3 [MD]      ED Course User Index  [MD] Wendi Snipes, MD         Clinical Impressions as of 08/27/23 1710   Chest pain, unspecified type       Reassessment:      Patient was reassessed just prior to disposition and disposition planning and test results were reviewed     Patient very well-appearing in the emergency department.  He wants to go home.  He knows to follow-up with PCP and I also gave him a  referral to cardiology.  He knows to return to the ED with any concerning symptoms.  Strict return precautions discussed with patient.  Disposition Decision:      Discharge     I had a detailed discussion with the patient and/or guardian regarding the historical points, exam findings, and any diagnostic results supporting the discharge diagnosis, based on the patient's history, exam, and diagnositic evaluation, there is no indication for further emergent intervention or inpatient treatment and verbal and written discharge instructions were provided. Patient was encouraged to return for any worsening symptoms, persisting symptoms, or any other concerns. Patient was provided the opportunity to ask questions     Patient was advised to follow-up as directed in discharge instructions.    Discussion with Independent Historian:      Patient  Medication Management:      Prescription medications were administered, prescribed, or evaluated during the ED visit  Independent Interpretation:      Cardiac:  EKG     Cardiac studies as interpreted by me:  NSR     Imaging:  x-ray     Imaging studies as interpreted by me:  No acute cardiopulmonary pathology  Chronic Conditions Affecting Care:      Other (GERD)                 Attestation   Attestation         Wendi Snipes, MD  08/27/23 2034

## 2023-08-27 NOTE — Nursing Note (Signed)
 Pt complains of left sided chest pain, shortness of breath, and dizzy that occurred while patient was at work at 11:40  Pt states the incidence has occurred several times today since that time  Hx of GERD, smoker, strong family hx of MI.

## 2023-09-21 ENCOUNTER — Ambulatory Visit: Payer: Medicaid Other | Admitting: Cardiology

## 2023-09-28 ENCOUNTER — Encounter: Payer: Self-pay | Admitting: Cardiology

## 2023-09-28 ENCOUNTER — Other Ambulatory Visit: Payer: Self-pay

## 2023-09-28 ENCOUNTER — Ambulatory Visit: Payer: Medicaid Other | Admitting: Cardiology

## 2023-09-28 VITALS — BP 118/80 | HR 88 | Ht 71.0 in | Wt 310.0 lb

## 2023-09-28 DIAGNOSIS — I480 Paroxysmal atrial fibrillation: Secondary | ICD-10-CM

## 2023-09-28 DIAGNOSIS — K219 Gastro-esophageal reflux disease without esophagitis: Secondary | ICD-10-CM

## 2023-09-28 DIAGNOSIS — Z789 Other specified health status: Secondary | ICD-10-CM

## 2023-09-28 DIAGNOSIS — I10 Essential (primary) hypertension: Secondary | ICD-10-CM

## 2023-09-28 MED ORDER — AMLODIPINE BESYLATE 5 MG PO TABS *I*
5.0000 mg | ORAL_TABLET | Freq: Every day | ORAL | 11 refills | Status: DC
Start: 2023-09-28 — End: 2024-05-05

## 2023-09-28 MED ORDER — OMEPRAZOLE 40 MG PO CPDR *I*
40.0000 mg | DELAYED_RELEASE_CAPSULE | Freq: Every day | ORAL | 11 refills | Status: DC | PRN
Start: 2023-09-28 — End: 2023-12-30

## 2023-09-28 NOTE — Progress Notes (Signed)
Cardiology Follow-Up Visit    Name: James Price PCP: Georgeanne Nim, NP   DOB: [birth year] Date of Visit: 09/28/2023     History of Present Illness   Reason for Visit: Follow-up-atrial fibrillation, hypertension    James Price is a 37 y.o. man with paroxysmal atrial fibrillation, hypertension, morbid obesity, OSA, borderline diabetes, and history of smoking who presents for follow-up.    The patient was last seen about 2 years ago.  In early October he was seen and the [facility] ED, presenting with chest tightness, shortness of breath, lightheadedness, and 1 episode of vomiting.  High-sensitivity troponin I measured 7 and 11.  ECG demonstrated normal sinus rhythm, early R wave transition, otherwise normal ECG.  CBC, CMP normal.  Since that ED visit he has not had recurrence of chest pain symptoms.  He has been off of acid reflux medication.  He admits to not being consistent with taking medications every day as prescribed.  Does not know the names of his medications offhand, but thinks he takes 3 prescription medications and 1 of which is as needed, probably metoprolol.  Unfortunately he lost his CPAP machine.  Last lipid profile was 06/2020 demonstrating total cholesterol 191, HDL 34, and LDL 132.  Last hemoglobin A1c also 06/2020 was mildly elevated at 6.1.  Updated family history notable for his father having an MI several months ago.  He is in his 5s.    Past Medical, Surgical, Family and Social History     Past Medical History:   Diagnosis Date    GERD (gastroesophageal reflux disease)     Obesity     OSA (obstructive sleep apnea) 11/28/2020    Paroxysmal atrial fibrillation     Prediabetes 07/16/2020    Tobacco use 11/04/2020     Past Surgical History:   Procedure Laterality Date    ORIF right tibia Right      Family History   Problem Relation Age of Onset    Heart Disease Mother     High Blood Pressure Mother     Substance abuse Mother     Heart attack Father     No Known Problems Sister      No Known Problems Brother     Substance abuse Maternal Grandmother      Social History     Socioeconomic History    Marital status: Single    Number of children: 3   Tobacco Use    Smoking status: Some Days     Packs/day: .5     Types: Cigarettes    Smokeless tobacco: Never   Substance and Sexual Activity    Alcohol use: Yes     Comment: 1-2 times month    Drug use: No     Comment: previous use x 3 weeks    Sexual activity: Yes     Partners: Female   Social History Narrative    Lives with Dad       Review of Systems   A 10+ system review was conducted and was negative except as above    Medications and Allergies     Patient's Medications   New Prescriptions    No medications on file   Previous Medications    ASPIRIN 325 MG TABLET    Take 1 tablet (325 mg total) by mouth 2 times daily    CPAP MACHINE ([facility] [phone])    5-20 cm H2O.  Issue all equip inc. Heated hose/humidifier.  Dx:  OSA  Length:99    DICLOFENAC (VOLTAREN) 1 % GEL    Apply 4 g to affected area 4 times daily.    EPINEPHRINE (EPIPEN) 0.3 MG/0.3 ML AUTO-INJECTOR    Inject 0.3 mLs (0.3 mg total) into the muscle as needed for Anaphylaxis    METOPROLOL TARTRATE (LOPRESSOR) 25 MG TABLET    Take 1 tablet (25 mg total) by mouth 2 times daily as needed (Palpitations, atrial fibrillation)   Modified Medications    Modified Medication Previous Medication    AMLODIPINE (NORVASC) 5 MG TABLET amLODIPine (NORVASC) 5 mg tablet       Take 1 tablet (5 mg total) by mouth daily.    Take 1 tablet (5 mg total) by mouth daily    OMEPRAZOLE (PRILOSEC) 40 MG CAPSULE omeprazole (PRILOSEC) 40 mg capsule       Take 1 capsule (40 mg total) by mouth daily as needed for Gastroesophageal Reflux Disease.    Take 1 capsule (40 mg total) by mouth daily as needed for Gastroesophageal Reflux Disease. **Schedule an appointment with Dr. Lyndel Pleasure for further refills. Call [phone].**   Discontinued Medications    No medications on file     Allergies   Allergen Reactions     Shellfish-Derived Products Itching     10/16/20: Skin test positive to shrimp, crab, lobster, clam, oyster. Smaller reaction to scallop did just meet criteria for positivity, although he is currently eating scallop without symptoms.        Vitals and Physical Exam     VS:  BP 118/80 (BP Location: Left arm, Patient Position: Sitting, Cuff Size: large adult)   Pulse 88   Ht 1.803 m (5\' 11" )   Wt (!) 140.6 kg (310 lb)   SpO2 96%   BMI 43.24 kg/m    Gen:  Appears comfortable  HENT:  NC/AT, neck supple, JVP not distended  Resp:  CTA  CV:  RRR, . No murmurs, rubs, or gallops. No lower extremity edema  GI:  Abdomen is soft, non-tender, non-distended  Msk:  No cyanosis, no clubbing  Skin:  No rashes    Laboratory Data     Lab Results   Component Value Date    CREAT 0.92 03/19/2022    UN 8 03/19/2022    NA 140 03/19/2022    K 4.6 03/19/2022    CL 104 03/19/2022    CO2 24 03/19/2022     No results for input(s): "CHOL", "HDL", "LDLC", "LDL", "TRIG", "NHDLC", "CHHDC" in the last 8760 hours.    Lab Results   Component Value Date    HA1C 6.1 (H) 07/12/2020       Cardiac/Imaging Data            EXERCISE STRESS ECHO COMPLETE 12/23/2021    Interpretation Summary  1.  Normal LV size, mass, and EF without regional wall motion abnormalities  2.  No significant valvular abnormalities  3.  Normal right heart size and function with indeterminate pulmonary artery systolic pressure  4.  Reduced functional capacity  5.  Negative exercise stress echo for myocardial ischemia at the moderate cardiac workload achieved              Orders Placed This Encounter   Procedures    Lipid Panel (Reflex to Direct  LDL if Triglycerides more than 400)    Hemoglobin A1c    Basic metabolic panel    EKG 12 lead       Impression & Plan  37 y.o. man with paroxysmal atrial fibrillation, hypertension, morbid obesity, OSA, borderline diabetes, and history of smoking who presents for follow-up.    Paroxysmal AF: No apparent recurrent episodes.  Currently in  sinus rhythm on exam.  Recent ED ECG reviewed and is normal.    HTN: BP reasonable.  Continue current amlodipine 5 mg daily.  Advised importance of taking medications daily.    Hyperlipidemia: Recheck fasted lipid profile.    Morbid obesity: Discussed the importance of losing weight.  Obesity is significantly contributing to negative health outcomes.  Check hemoglobin A1c.  Discussed potential for medication like Ozempic.    OSA: Lost CPAP machine.  He did not routinely use it when it was available.  We can continue to revisit this over time.    I will plan on following up with James Price again in 3 months.  Thank you for allowing me the opportunity to participate in the care of your patient.          [name], MD  Cardiovascular Disease  [facility]

## 2023-09-28 NOTE — Patient Instructions (Addendum)
Go for blood work in the near future  Continue amlodipine for blood pressure  Follow up  in 3 months

## 2023-09-29 LAB — EKG 12-LEAD
P: 46 deg
PR: 157 ms
QRS: 40 deg
QRSD: 87 ms
QT: 362 ms
QTc: 417 ms
Rate: 80 {beats}/min
T: 49 deg

## 2023-12-30 ENCOUNTER — Other Ambulatory Visit: Payer: Self-pay

## 2023-12-30 ENCOUNTER — Other Ambulatory Visit: Payer: Self-pay | Admitting: Cardiology

## 2023-12-30 DIAGNOSIS — K219 Gastro-esophageal reflux disease without esophagitis: Secondary | ICD-10-CM

## 2023-12-30 NOTE — Telephone Encounter (Signed)
Need more info regarding pharmacy. MyChart sent.

## 2023-12-31 MED ORDER — OMEPRAZOLE 40 MG PO CPDR *I*
40.0000 mg | DELAYED_RELEASE_CAPSULE | Freq: Every day | ORAL | 11 refills | Status: DC | PRN
Start: 2023-12-31 — End: 2024-05-05

## 2024-01-04 ENCOUNTER — Ambulatory Visit: Payer: Medicaid Other | Admitting: Cardiology

## 2024-01-05 ENCOUNTER — Telehealth: Payer: Self-pay | Admitting: Cardiology

## 2024-01-05 ENCOUNTER — Encounter: Payer: Self-pay | Admitting: Cardiology

## 2024-01-05 NOTE — Telephone Encounter (Signed)
Patient missed a follow up appointment with Dr.Venci on 01/04/24.

## 2024-01-05 NOTE — Telephone Encounter (Signed)
Rescheduled missed appt with pt. No show letter sent out. DONE!

## 2024-01-12 ENCOUNTER — Ambulatory Visit: Payer: Medicaid Other | Admitting: Cardiology

## 2024-01-13 ENCOUNTER — Telehealth: Payer: Self-pay | Admitting: Cardiology

## 2024-01-13 ENCOUNTER — Encounter: Payer: Self-pay | Admitting: Cardiology

## 2024-01-13 NOTE — Telephone Encounter (Signed)
 Patient missed a follow up appointment with Dr.Venci on 01/12/24.

## 2024-01-13 NOTE — Telephone Encounter (Signed)
 Pt mailbox is full. No show letter is sent out. DONE!

## 2024-01-27 ENCOUNTER — Emergency Department
Admission: EM | Admit: 2024-01-27 | Discharge: 2024-01-27 | Disposition: A | Discharge status: Home / Self Care | Source: Ambulatory Visit | Attending: Emergency Medicine | Admitting: Emergency Medicine

## 2024-01-27 ENCOUNTER — Emergency Department

## 2024-01-27 ENCOUNTER — Other Ambulatory Visit: Payer: Self-pay

## 2024-01-27 DIAGNOSIS — R9431 Abnormal electrocardiogram [ECG] [EKG]: Secondary | ICD-10-CM

## 2024-01-27 DIAGNOSIS — R0602 Shortness of breath: Secondary | ICD-10-CM

## 2024-01-27 DIAGNOSIS — I213 ST elevation (STEMI) myocardial infarction of unspecified site: Secondary | ICD-10-CM

## 2024-01-27 DIAGNOSIS — R079 Chest pain, unspecified: Secondary | ICD-10-CM | POA: Insufficient documentation

## 2024-01-27 DIAGNOSIS — R1110 Vomiting, unspecified: Secondary | ICD-10-CM

## 2024-01-27 DIAGNOSIS — Z789 Other specified health status: Secondary | ICD-10-CM

## 2024-01-27 DIAGNOSIS — F17210 Nicotine dependence, cigarettes, uncomplicated: Secondary | ICD-10-CM | POA: Insufficient documentation

## 2024-01-27 DIAGNOSIS — R0789 Other chest pain: Secondary | ICD-10-CM

## 2024-01-27 LAB — CBC AND DIFFERENTIAL
Baso # K/uL: 0.1 10*3/uL (ref 0.0–0.2)
Eos # K/uL: 0.5 10*3/uL (ref 0.0–0.5)
Hematocrit: 46 % (ref 37–52)
Hemoglobin: 15.3 g/dL (ref 12.0–17.0)
IMM Granulocytes #: 0.1 10*3/uL — ABNORMAL HIGH (ref 0.0–0.0)
IMM Granulocytes: 0.4 %
Lymph # K/uL: 3.7 10*3/uL (ref 1.0–5.0)
MCV: 89 fL (ref 75–100)
Mono # K/uL: 1.1 10*3/uL — ABNORMAL HIGH (ref 0.1–1.0)
Neut # K/uL: 6.1 10*3/uL (ref 1.5–6.5)
Nucl RBC # K/uL: 0 10*3/uL (ref 0.0–0.0)
Nucl RBC %: 0 /100{WBCs} (ref 0.0–0.2)
Platelets: 329 10*3/uL (ref 150–450)
RBC: 5.2 MIL/uL (ref 4.0–6.0)
RDW: 14.4 % (ref 0.0–15.0)
Seg Neut %: 53 %
WBC: 11.5 10*3/uL — ABNORMAL HIGH (ref 3.5–11.0)

## 2024-01-27 LAB — BASIC METABOLIC PANEL
Anion Gap: 11 (ref 7–16)
CO2: 24 mmol/L (ref 20–28)
Calcium: 8.8 mg/dL — ABNORMAL LOW (ref 9.0–10.3)
Chloride: 103 mmol/L (ref 96–108)
Creatinine: 0.9 mg/dL (ref 0.67–1.17)
Glucose: 125 mg/dL — ABNORMAL HIGH (ref 60–99)
Lab: 9 mg/dL (ref 6–20)
Potassium: 3.8 mmol/L (ref 3.3–5.1)
Sodium: 138 mmol/L (ref 133–145)
eGFR BY CREAT: 111 *

## 2024-01-27 LAB — TROPONIN T 3 HR W/ DELTA HIGH SENSITIVITY (IP/ED ONLY)
TROP T 0-3 HR DELTA High Sensitivity: 1 (ref 0–4)
TROP T 3 HR High Sensitivity: 7 ng/L (ref 0–14)

## 2024-01-27 LAB — EKG 12-LEAD
P: 71 deg
PR: 161 ms
QRS: 33 deg
QRSD: 89 ms
QT: 366 ms
QTc: 437 ms
Rate: 86 {beats}/min
T: 89 deg

## 2024-01-27 LAB — TROPONIN T 0 HR HIGH SENSITIVITY (IP/ED ONLY): TROP T 0 HR High Sensitivity: 6 ng/L (ref 0–11)

## 2024-01-27 MED ORDER — DEXTROSE 5 % FLUSH FOR PUMPS *I*
0.0000 mL/h | INTRAVENOUS | Status: DC | PRN
Start: 2024-01-27 — End: 2024-01-27

## 2024-01-27 MED ORDER — FAMOTIDINE (PF) 20 MG/2ML IV SOLN *I*
20.0000 mg | Freq: Once | INTRAVENOUS | Status: AC
Start: 2024-01-27 — End: 2024-01-27
  Administered 2024-01-27: 20 mg via INTRAVENOUS
  Filled 2024-01-27: qty 2

## 2024-01-27 MED ORDER — SODIUM CHLORIDE 0.9 % FLUSH FOR PUMPS *I*
0.0000 mL/h | INTRAVENOUS | Status: DC | PRN
Start: 2024-01-27 — End: 2024-01-27

## 2024-01-27 MED ORDER — OMEPRAZOLE 40 MG PO CPDR *I*
40.0000 mg | DELAYED_RELEASE_CAPSULE | Freq: Every day | ORAL | 0 refills | Status: AC
Start: 2024-01-27 — End: 2024-02-26
  Filled 2024-01-27: qty 30, 30d supply, fill #0

## 2024-01-27 MED ORDER — PANTOPRAZOLE SODIUM 40 MG IV SOLR *I*
40.0000 mg | INTRAVENOUS | Status: DC
Start: 2024-01-27 — End: 2024-01-27
  Administered 2024-01-27: 40 mg via INTRAVENOUS
  Filled 2024-01-27: qty 10

## 2024-01-27 NOTE — Discharge Instructions (Addendum)
-  You were seen and evaluated in the emergency department today for chest pain and shortness of breath. You had a chest x ray which showed no evidence of infection. You also had lab work which showed no evidence of infection, dehydration, or new/ongoing heart damage.    -Please continue to take all home medications as previously prescribed.     -The following medication was sent to the hospital pharmacy, please pick this up before you leave:  Omeprazole 40 mg by mouth daily for acid reflux.     -Be sure to rest and drink plenty of fluids.     -You may call your primary care doctor and notify them that you were seen in the emergency department. Please follow-up with them for re-evaluation as needed.    -Please be sure to go to your already scheduled cardiology appointment on 03/03/2024.     -Return to the emergency department for any worsening symptoms- increased/worsening heart damage, repeated vomiting, difficulty breathing, or any other concerns.

## 2024-01-27 NOTE — ED Provider Notes (Signed)
 History     Chief Complaint   Patient presents with    Chest Pain     James Price is a 38 y.o. male with PMHx of paroxysmal atrial fibrillation, GERD, obesity, OSA, tobacco use, and prediabetes who presents to the ED with left-sided chest pain.  Patient states that at approximately 3 AM this morning the patient was woken up with left-sided chest pain that he describes as a sharp pain that does not radiate elsewhere.  He endorses associated shortness of breath and 1 episode of vomiting.  He denies any dizziness, lightheadedness, persistent nausea, abdominal pain, or leg pain/swelling.  Patient states he has a history of GERD and has run out of his GERD medication.  He states the 1 episode of vomit did have a sour taste.    Of note, patient did see cardiology November 2024.  He missed his subsequent appointment stating that he "did not get there on time".  He has another appointment scheduled in April 2025.      History provided by:  Patient and medical records  Language interpreter used: No      Medical/Surgical/Family History     Past Medical History:   Diagnosis Date    GERD (gastroesophageal reflux disease)     Obesity     OSA (obstructive sleep apnea) 11/28/2020    Paroxysmal atrial fibrillation     Prediabetes 07/16/2020    Tobacco use 11/04/2020        Patient Active Problem List   Diagnosis Code    Other chest pain R07.89    Obesity, unspecified E66.9    Hyperlipidemia, unspecified E78.5    GERD (gastroesophageal reflux disease) K21.9    Prediabetes R73.03    Paroxysmal atrial fibrillation I48.0    Tobacco use Z72.0    OSA (obstructive sleep apnea) G47.33            Past Surgical History:   Procedure Laterality Date    ORIF right tibia Right           Social History[1]      Review of Systems    Physical Exam     Triage Vitals  Triage Start: Start, (01/27/24 0326)  First Recorded BP: 132/80, Resp: 16, Temp: 36.5 C (97.7 F) Oxygen Therapy SpO2: 100 %, O2 Device: None (Room air), Heart Rate: 88,  (01/27/24 0327)  .  First Pain Reported  0-10 Scale: 7, Pain Location/Orientation: Chest, (01/27/24 0327)     Physical Exam  Vitals and nursing note reviewed.   Constitutional:       General: He is awake. He is not in acute distress.     Appearance: He is well-developed and normal weight. He is not ill-appearing, toxic-appearing or diaphoretic.      Comments: Patient lying back in exam chair and in no acute distress. Patient appears overall well.    HENT:      Head: Normocephalic and atraumatic.   Eyes:      Conjunctiva/sclera: Conjunctivae normal.   Cardiovascular:      Rate and Rhythm: Normal rate and regular rhythm.      Pulses: Normal pulses.      Heart sounds: Normal heart sounds. No murmur heard.  Pulmonary:      Effort: Pulmonary effort is normal. No respiratory distress.      Breath sounds: Normal breath sounds. No decreased breath sounds, wheezing, rhonchi or rales.   Abdominal:      General: Abdomen is flat. Bowel sounds are normal.  There is no distension.      Palpations: Abdomen is soft.      Tenderness: There is no abdominal tenderness. There is no guarding.   Musculoskeletal:      Right lower leg: No swelling or tenderness. No edema.      Left lower leg: No swelling or tenderness. No edema.   Neurological:      Mental Status: He is alert and oriented to person, place, and time.   Psychiatric:         Mood and Affect: Mood normal.         Behavior: Behavior normal. Behavior is cooperative.         Thought Content: Thought content normal.         Judgment: Judgment normal.       Medical Decision Making   Patient seen by me on:  01/27/2024    Assessment:    James Price is a 38 y.o. male with PMHx of paroxysmal atrial fibrillation, GERD, obesity, OSA, tobacco use, and prediabetes who presents to the ED with left-sided chest pain.  Vitals are reassuring and patient is afebrile.  SpO2: 100% on RA.  HR: 88.  Lungs CTAB and heart NSR.  Abdominal exam is benign.  No lower extremity edema bilaterally and no  TTP to calves bilaterally.  My suspicion is that the patient has untreated GERD that is the cause of his symptoms.  Out of an abundance of caution and given patient's risk factors (prediabetes, HTN, tobacco use) will obtain labs, imaging, and reassess.    Differential diagnosis:    -GERD  -ACS- unlikely as patient with very atypical presentation most consistent with GERD  -MSK pain    Plan:    -Orders Placed This Encounter:      *Chest standard frontal and lateral views      CBC and differential      Basic metabolic panel      Troponin T 0 HR High Sensitivity      Troponin T 3 HR W/ Delta High Sensitivity      EKG 12 lead      EKG: follow up      Insert peripheral IV    ED Course and Disposition:    Imaging:  -Chest standard frontal and lateral views  No acute cardiopulmonary disease.    Labs:  -CBC > WBC: 11.5, Hgb: 15.3, Hct: 46, Plt: 329  -BMP > Na: 138, K: 3.8, Ca: 8.8, glucose: 125  -Troponin 0 hour: 6  -Troponin 3 hour: pending    -Patient signed out to morning team pending 3 hour trop. Anticipate likely discharge as symptoms most consistent with GERD. Omeprazole script sent to the pharmacy.        James Price, [place]            [1]   Social History  Tobacco Use    Smoking status: Some Days     Packs/day: .5     Types: Cigarettes    Smokeless tobacco: Never   Substance Use Topics    Alcohol use: Yes     Comment: 1-2 times month    Drug use: No     Comment: previous use x 3 weeks        James Price, [place]  01/27/24 0700

## 2024-01-27 NOTE — ED Provider Notes (Signed)
 Received patient in sign out. Briefly, this is a 38 year old male with PMHx of paroxysmal atrial fibrillation, GERD, obesity, OSA, tobacco use, and prediabetes who presents to the ED with left-sided chest pain. Symptoms believed to be most likely related to GERd, as the patient has been out of his GERD medications, and did have one episode of sour tasting emesis. Labwork very reassuring with adynamic normal troponins. EKG reassuring. Chest x-ray negative. Will discharge to follow up with outpatient cardiologist. I had a lengthy discussion with the patient in regard to red flag signs and symptoms, as well as return precautions. They verbalized understanding of this, and will follow up as discussed.       Campbell Riches, NP  01/27/24 [phone]

## 2024-01-27 NOTE — ED Notes (Signed)
 Report Given To  Stacy, RN      Descriptive Sentence / Reason for Admission   Pt endorsing left sided chest pain that woke him up out of a sleep with SOB, pain still persistent on the left side radiating to his arm. Denies cardiac hx.       Active Issues / Relevant Events   Unknown Code Status  A/Ox4  Endorsing chest pain and DOE      To Do List  V/A per policy  Meds per [facility]      Anticipatory Guidance / Discharge Planning  Pending ED evaluation

## 2024-01-27 NOTE — ED Notes (Signed)
 Discharge Note   ______________________  Reviewed AVS with [name]    A&Ox4. Ambulatory and stable. All questioned answered, no concerns voiced. Verbalized understanding of all discharge instructions and copy was provided.     Terrell Brake's clothing and footwear were appropriate for weather  upon discharge.  ___________________________________________________________________    Patient accompanied [id number]  Mode of  transportation: ambulatory  Discharged destination:home    All questions answered. Repeat VS declined.    ______________________________________________________________    Signed by Brandy Hale, RN as of @TD  at  8:01 AM

## 2024-01-27 NOTE — ED Triage Notes (Signed)
 Pt endorsing left sided chest pain that woke him up out of a sleep with SOB, pain still persistent on the left side radiating to his arm.  Denies cardiac hx.  EKG in triage.     Prehospital medications given: No

## 2024-03-30 ENCOUNTER — Other Ambulatory Visit: Payer: Self-pay

## 2024-03-30 ENCOUNTER — Encounter: Payer: Self-pay | Admitting: Cardiology

## 2024-03-30 ENCOUNTER — Emergency Department

## 2024-03-30 ENCOUNTER — Emergency Department
Admission: EM | Admit: 2024-03-30 | Discharge: 2024-03-30 | Disposition: A | Discharge status: Home / Self Care | Source: Ambulatory Visit | Attending: Palliative Care | Admitting: Palliative Care

## 2024-03-30 DIAGNOSIS — I10 Essential (primary) hypertension: Secondary | ICD-10-CM

## 2024-03-30 DIAGNOSIS — R079 Chest pain, unspecified: Secondary | ICD-10-CM | POA: Insufficient documentation

## 2024-03-30 DIAGNOSIS — Z87891 Personal history of nicotine dependence: Secondary | ICD-10-CM

## 2024-03-30 DIAGNOSIS — Z789 Other specified health status: Secondary | ICD-10-CM

## 2024-03-30 DIAGNOSIS — F17210 Nicotine dependence, cigarettes, uncomplicated: Secondary | ICD-10-CM | POA: Insufficient documentation

## 2024-03-30 LAB — BASIC METABOLIC PANEL
Anion Gap: 8 (ref 7–16)
CO2: 27 mmol/L (ref 20–28)
Calcium: 9.3 mg/dL (ref 9.0–10.3)
Chloride: 102 mmol/L (ref 96–108)
Creatinine: 0.76 mg/dL (ref 0.67–1.17)
Glucose: 89 mg/dL (ref 60–99)
Lab: 7 mg/dL (ref 6–20)
Potassium: 4.5 mmol/L (ref 3.3–5.1)
Sodium: 137 mmol/L (ref 133–145)
eGFR BY CREAT: 117 *

## 2024-03-30 LAB — CBC AND DIFFERENTIAL
Baso # K/uL: 0.1 10*3/uL (ref 0.0–0.2)
Eos # K/uL: 0.3 10*3/uL (ref 0.0–0.5)
Hematocrit: 48 % (ref 37–52)
Hemoglobin: 15.8 g/dL (ref 12.0–17.0)
IMM Granulocytes #: 0 10*3/uL (ref 0.0–0.0)
IMM Granulocytes: 0.4 %
Lymph # K/uL: 2.6 10*3/uL (ref 1.0–5.0)
MCV: 88 fL (ref 75–100)
Mono # K/uL: 0.6 10*3/uL (ref 0.1–1.0)
Neut # K/uL: 5.3 10*3/uL (ref 1.5–6.5)
Nucl RBC # K/uL: 0 10*3/uL (ref 0.0–0.0)
Nucl RBC %: 0 /100{WBCs} (ref 0.0–0.2)
Platelets: 362 10*3/uL (ref 150–450)
RBC: 5.5 MIL/uL (ref 4.0–6.0)
RDW: 14.7 % (ref 0.0–15.0)
Seg Neut %: 59.3 %
WBC: 8.9 10*3/uL (ref 3.5–11.0)

## 2024-03-30 LAB — TROPONIN T 3 HR W/ DELTA HIGH SENSITIVITY (IP/ED ONLY): TROP T 3 HR High Sensitivity: <6 ng/L (ref 0–14)

## 2024-03-30 LAB — EKG 12-LEAD
P: 68 deg
PR: 176 ms
QRS: 63 deg
QRSD: 86 ms
QT: 378 ms
QTc: 418 ms
Rate: 74 {beats}/min
T: 76 deg

## 2024-03-30 LAB — TROPONIN T 0 HR HIGH SENSITIVITY (IP/ED ONLY): TROP T 0 HR High Sensitivity: <6 ng/L (ref 0–11)

## 2024-03-30 MED ORDER — LIDOCAINE 4 % EX PATCH *I*
1.0000 | MEDICATED_PATCH | CUTANEOUS | 0 refills | Status: AC
Start: 2024-03-30 — End: ?

## 2024-03-30 MED ORDER — ACETAMINOPHEN 325 MG PO TABS *I*
975.0000 mg | ORAL_TABLET | Freq: Once | ORAL | Status: AC
Start: 2024-03-30 — End: 2024-03-30
  Administered 2024-03-30: 975 mg via ORAL
  Filled 2024-03-30: qty 3

## 2024-03-30 MED ORDER — LIDOCAINE 4 % EX PATCH *I*
1.0000 | MEDICATED_PATCH | CUTANEOUS | Status: DC
Start: 2024-03-30 — End: 2024-05-29
  Administered 2024-03-30: 1 via TRANSDERMAL
  Filled 2024-03-30: qty 1

## 2024-03-30 MED ORDER — ACETAMINOPHEN 325 MG PO TABS *I*
650.0000 mg | ORAL_TABLET | ORAL | 0 refills | Status: AC | PRN
Start: 2024-03-30 — End: ?

## 2024-03-30 MED ORDER — FAMOTIDINE (PF) 20 MG/2ML IV SOLN *I*
20.0000 mg | Freq: Once | INTRAVENOUS | Status: AC
Start: 2024-03-30 — End: 2024-03-30
  Administered 2024-03-30: 20 mg via INTRAVENOUS
  Filled 2024-03-30: qty 2

## 2024-03-30 NOTE — ED Provider Notes (Signed)
 History     Chief Complaint   Patient presents with   . Chest Pain     HPI    History of Present Illness  The patient is a 38 year old male with history of paroxysmal atrial fibrillation, hypertension, borderline diabetes, morbid obesity, OSA, and history of smoking who presents for chest pain.    He reports experiencing a persistent pressure to his central and left chest for approximately 1 month. Over the past week, this discomfort has escalated to a sharp, constant left chest pain, which he rates as 6 on a scale of 0 to 10. He also experiences intermittent sharp pains and a transient burning sensation. He recalls feeling a pressing sensation on his chest upon arriving at work at 9:00 AM, which subsided but was replaced by a sharp pain while seated. He has not identified any specific triggers or alleviating factors for his pain. He occasionally experiences intermittent associated left arm pain, to the wrist or elbow. He reports no fever or cough but admits to sweating earlier today when the pain began. He experienced shortness of breath on Tuesday two days ago, without the accompanying sharp pain. He reports no nausea, dizziness, or lightheadedness today. He occasionally experiences palpitations, although none were reported today. He has not experienced any recent trauma to the chest, and has not been exercising or heavy lifting. He reports no leg pain or swelling. He has been managing his symptoms with aspirin  325 mg, metoprolol , and an acid reflux medication, but these have not provided relief. He has previously consulted a cardiologist but has not been able to coordinate a follow up appointment.    MEDICATIONS  Bayer aspirin , metoprolol           Medical/Surgical/Family History     Past Medical History:   Diagnosis Date   . GERD (gastroesophageal reflux disease)    . Obesity    . OSA (obstructive sleep apnea) 11/28/2020   . Paroxysmal atrial fibrillation    . Prediabetes 07/16/2020   . Tobacco use 11/04/2020         Patient Active Problem List   Diagnosis Code   . Other chest pain R07.89   . Obesity, unspecified E66.9   . Hyperlipidemia, unspecified E78.5   . GERD (gastroesophageal reflux disease) K21.9   . Prediabetes R73.03   . Paroxysmal atrial fibrillation I48.0   . Tobacco use Z72.0   . OSA (obstructive sleep apnea) G47.33            Past Surgical History:   Procedure Laterality Date   . ORIF right tibia Right           Social History[1]          Review of Systems    Physical Exam     Triage Vitals  Triage Start: Start, (03/30/24 1215)  First Recorded BP: 119/68, Resp: 18, Temp: 35.8 C (96.4 F), Temp src: TEMPORAL Oxygen Therapy SpO2: 99 %, Oximetry Source: Lt Hand, O2 Device: None (Room air), Heart Rate: 74, (03/30/24 1218)  .  First Pain Reported  0-10 Scale: 7, (03/30/24 1218)       Physical Exam    Physical Exam  The patient is awake, alert, and appears comfortable. He is conversational without difficulty.  The head is normocephalic and atraumatic. Pupils are 3 mm, equal, and reactive. The oropharynx is clear.  The lungs are clear with normal respiratory effort.  The heart has a normal rate and regular rhythm.  There is mild tenderness in  the left chest that reproduces the pain of complaint.  The abdomen is normal with bowel sounds present. It is soft, nondistended, nontender, and shows no guarding or rebound tenderness.  There is no pitting pedal edema. The calves are nontender. The radial pulse is 2+ and symmetric.      09/28/23 Cardiology office visit shows:  Reason for Visit: Follow-up-atrial fibrillation, hypertension  Impression & Plan   38 y.o. man with paroxysmal atrial fibrillation, hypertension, morbid obesity, OSA, borderline diabetes, and history of smoking who presents for follow-up.  Paroxysmal AF: No apparent recurrent episodes.  Currently in sinus rhythm on exam.  Recent ED ECG reviewed and is normal.  HTN: BP reasonable.  Continue current amlodipine  5 mg daily.  Advised importance of taking  medications daily.  Hyperlipidemia: Recheck fasted lipid profile.  Morbid obesity: Discussed the importance of losing weight.  Obesity is significantly contributing to negative health outcomes.  Check hemoglobin A1c.  Discussed potential for medication like Ozempic.  OSA: Lost CPAP machine.  He did not routinely use it when it was available.  We can continue to revisit this over time.  I will plan on following up with Mr Rodi again in 3 months.       03/30/24 1626   Wells score for PE   Clinical symptoms of DVT (ie leg swelling, pain with palpation) 0 - No   PE is primary diagnosis or equally likely 0 - No   Heart rate > 100 beats/min 0 - No   Prior history of venous thromboembolism (DVT or PE) 0 - No   Immobilization (>/= 3 days) or underwent surgery in the previous 4 weeks 0 - No   Malignancy with treatment within the last 6 months or palliative treatment 0 - No   Hemoptysis 0 - No   Wells score (Pulmonary Embolism Risk Stratification) 0   Intervention Complete PERC Score   PERC Score   Age>/= 40 years old 0 - No   Pulse >/= 100 beats/min 0 - No   O2 Sat <95% on room air 0 - No   Hemoptysis 0 - No   Prior VTE history (DVT or PE) 0 - No   Surgery or trauma within the past 4 weeks 0 - No   Exogenous estrogen use 0 - No   Unilateral leg swelling 0 - No   Perc Score 0   Intervention Negative PERC-No PE       Medical Decision Making     Assessment & Plan  Assessment: The patient is a 38 yo male reporting a month-long history of pressure in the center of his chest, slightly to the left, with a sharp pain persisting for the past week. The pain is constant today, rated 6/10, and is not relieved by Bayer aspirin  325 mg, metoprolol , or an acid reflux pill. There is tenderness upon palpation of the chest, reproducing the pain. Differential diagnosis includes chest wall pain, acute coronary syndrome (ACS), and unlikely pulmonary embolism. An electrocardiogram (ECG), chest x-ray, and laboratory tests have been ordered.  Acetaminophen  and a lidocaine  patch have been prescribed for analgesia. Further evaluation by his cardiologist is advised.    DDx:   - Chest wall pain  - Acute coronary syndrome (ACS)  - Unlikely pulmonary embolism    Plan:   - Electrocardiogram (ECG)  - Chest x-ray  - Laboratory tests  - Acetaminophen   - Lidocaine  patch    Disposition: If the results do not indicate any significant abnormalities, he  may be discharged with instructions to follow up with his primary care physician and cardiologist.    Review of prior external notes, Cardiology office visit, shows:  Past history as above    Order tests: as below  Review of test results shows as below    Orders Placed This Encounter   Procedures   . *Chest standard frontal and lateral views   . Basic metabolic panel   . Troponin T 0 HR High Sensitivity   . Troponin T 3 HR W/ Delta High Sensitivity   . CBC and differential   . EKG 12 lead   . EKG: follow up   . Insert peripheral IV       I independently interpreted ECG showing NSR with no acute ischemic changes  I independently interpreted imaging, chest x-ray showing no infiltrate; [facility] [employer] impression is as below    Prescription drug management: Prescribe lidocaine  patch, acetaminophen     Impressions:  Non specific chest pain          ED Course as of 03/30/24 1818   Thu Mar 30, 2024   1605 WBC: 8.9   1605 Hematocrit: 48   1605 Platelets: 362   1605 Sodium: 137   1605 Potassium: 4.5   1605 Chloride: 102   1605 CO2: 27   1605 UN: 7   1605 Creatinine: 0.76   1605 Glucose: 89   1605 Calcium: 9.3   1605 TROP T 0 HR High Sensitivity: <6   1606 *Chest standard frontal and lateral views  No acute findings.   1815 TROP T 3 HR High Sensitivity: <6   1815 Laboratory tests are unrevealing for significant abnormality. Patient requests discharge. We will discharge to follow up with PCP and Cardiology.       Judieth Nova, MD          Author:  Judieth Nova, MD             [1]  Social History  Tobacco Use   .  Smoking status: Some Days     Packs/day: .5     Types: Cigarettes   . Smokeless tobacco: Never   Substance Use Topics   . Alcohol use: Yes     Comment: 1-2 times month   . Drug use: No     Comment: previous use x 3 weeks      Judieth Nova, MD  03/30/24 1820

## 2024-03-30 NOTE — Discharge Instructions (Signed)
 Your laboratory tests, ECG, chest x-ray show no significant abnormality.    Call doctor or return for worsening chest pain, shortness of breath, fever more than 101 degrees, or other concern.

## 2024-03-30 NOTE — ED Triage Notes (Signed)
 Complaints of chest pain for the last hour. Endorsing SOB. EKG in triage.         Prehospital medications given: No

## 2024-04-03 ENCOUNTER — Telehealth: Payer: Self-pay | Admitting: Cardiology

## 2024-04-03 NOTE — Telephone Encounter (Addendum)
 Spoke to patient who states over the last month he has been having symptoms daily.  Reports center/left sided chest discomfort that is usually a pressure, but sometimes is a burning or sharp pain with associated shortness of breath and lightheadedness.  Denies aggravating factors.    Symptoms resolve within 20 minutes to an hour.  If symptoms persist, then he goes to the emergency room.  Will take an aspirin  or omeprazole , but is unsure if either helps.    States he is taking ASA 325 mg and metoprolol  25 mg as needed.  States he has never taken amlodipine .  Does not take vital signs at home.    Reviewed that he should be evaluated in the ED is symptoms reoccur and do not resolve.

## 2024-04-03 NOTE — Telephone Encounter (Signed)
 Called James Price and relayed Dr. Lawana Pray message. He is currently not having symptoms, but will proceed to the ER if they recur. Told him I will send message to Dr. Vonne Guarneri so that he is aware and we will call him back tomorrow if Dr. Vonne Guarneri wants him to be seen/recommends any further testing.

## 2024-04-03 NOTE — Telephone Encounter (Signed)
 James Price  P Amb Card Schedulers (supporting Pasquale Bones, MD)2 days ago       Chest pains light headed can't breathe  some time pain in left arm and it always feels like something is pressing dwn on my chest       [name]3 days ago     KD  [name],        We see you are requesting to be seen. Are you currently experiencing any cardiac symptoms? If so, please describe for triaging purposes.       James Price  P Amb Card Schedulers (supporting Pasquale Bones, MD)4 days ago       Appointment Request From: James Price     With Provider: [name], MD [[facility]]     Preferred Date Range: 03/31/2024 - 04/09/2024     Preferred Times: Any     Reason for visit: Request a Wellness Visit/Physical     Health Maintenance Topic:      Comments:  My heart

## 2024-04-03 NOTE — Telephone Encounter (Signed)
 Given the worrisome nature of his symptoms, I would recommend he be evaluated in the ED as soon as possible for chest pain and shortness of breath.

## 2024-04-04 NOTE — Telephone Encounter (Signed)
 Please advise for him to take omeprazole  every day as prescribed.

## 2024-04-04 NOTE — Telephone Encounter (Signed)
 Spoke with patient and relayed message per NV. Reiterated ED precautions for return of symptoms. Patient verbalized agreement and understanding.

## 2024-04-04 NOTE — Telephone Encounter (Signed)
 Yes, visit within 1 month me or APP.  Thank you.

## 2024-04-04 NOTE — Telephone Encounter (Signed)
 Spoke with patient- scheduled 1 month follow up with ML for 6/20 at [facility]

## 2024-05-05 ENCOUNTER — Telehealth: Payer: Self-pay

## 2024-05-05 ENCOUNTER — Ambulatory Visit: Attending: Cardiology

## 2024-05-05 ENCOUNTER — Other Ambulatory Visit: Payer: Self-pay

## 2024-05-05 VITALS — BP 140/68 | HR 77 | Ht 70.0 in | Wt 310.0 lb

## 2024-05-05 DIAGNOSIS — R9431 Abnormal electrocardiogram [ECG] [EKG]: Secondary | ICD-10-CM

## 2024-05-05 DIAGNOSIS — Z789 Other specified health status: Secondary | ICD-10-CM

## 2024-05-05 DIAGNOSIS — K219 Gastro-esophageal reflux disease without esophagitis: Secondary | ICD-10-CM

## 2024-05-05 DIAGNOSIS — R0789 Other chest pain: Secondary | ICD-10-CM

## 2024-05-05 DIAGNOSIS — G4733 Obstructive sleep apnea (adult) (pediatric): Secondary | ICD-10-CM

## 2024-05-05 MED ORDER — AMLODIPINE BESYLATE 5 MG PO TABS *I*
5.0000 mg | ORAL_TABLET | Freq: Every day | ORAL | 11 refills | Status: DC
Start: 2024-05-05 — End: 2024-06-22

## 2024-05-05 MED ORDER — OMEPRAZOLE 40 MG PO CPDR *I*: 40.0000 mg | DELAYED_RELEASE_CAPSULE | Freq: Every day | ORAL | 11 refills | Status: DC | PRN

## 2024-05-05 MED ORDER — METOPROLOL TARTRATE 25 MG PO TABS *I*
25.0000 mg | ORAL_TABLET | Freq: Two times a day (BID) | ORAL | 5 refills | Status: AC | PRN
Start: 2024-05-05 — End: ?

## 2024-05-05 NOTE — Progress Notes (Signed)
 [facility]        Cardiology Office Revisit Note    Date of Visit: 05/05/2024 Patient: James Price   Patients PCP: Dennise Maudie Caldron, NP  Cardiologist: Dr. Katharina Patient DOB: [birth year]  EMRN: [id number]     Subjective/Reason For Visit     I had the pleasure of seeing Leonard Feigel in cardiology followup on 05/05/2024. He has a history of paroxysmal atrial fibrillation in 2021 s/p cardioversion, hypertension, morbid obesity, OSA, borderline diabetes, and history of smoking here for evaluation of chest pain. Had an exercise stress echo negative for ischemia in 2023. His dad had a heart attack in his 96s.    He was last seen in November and was doing okay, labs were ordered.  Since last seen he has been to the emergency room multiple times for chest discomfort.  Most recent 03/30/2024.  Troponin level less than 6, chest x-ray with no acute findings, EKG sinus rhythm.  He has not yet completed his labs.    HPI: He reports he has been having chest pain every day. He continues to have the same chest pain as he had while he was in the ER. It comes on while at rest/randomly. Sometimes when he drives. Feels like heaviness/pressure on the left side. Feels tight in the center of his chest. It is relieved by reclining back, on it's own, or with an aspirin . Sometimes improves with eating. Has been going on for 6 years, is more frequent now. Lasts 30 minutes or so. Doesn't think it is any worse after smoking a cigarette.    He takes his omeprazole , metoprolol  succinate every other day. Sometimes takes metoprolol  during episode and this helps. He does not take amlodipine , never picked it up. Smokes cigarettes 1 pack per day-2 days. Doesn't know where his CPAP machine went. Drinks alcohol a couple drinks a day. He works at [employer], doesn't do any regular exercise but walks at work. Doesn't get discomfort while ambulating.    Past Medical History:   Diagnosis Date    GERD (gastroesophageal reflux disease)      Obesity     OSA (obstructive sleep apnea) 11/28/2020    Paroxysmal atrial fibrillation     Prediabetes 07/16/2020    Tobacco use 11/04/2020     Past Surgical History:   Procedure Laterality Date    ORIF right tibia Right      Review of Systems   Constitutional: Negative.    HENT: Negative.     Eyes: Negative.    Respiratory:  Negative for hemoptysis and shortness of breath.    Cardiovascular:  Positive for chest pain. Negative for palpitations, orthopnea, claudication, leg swelling and PND.   Gastrointestinal: Negative.    Musculoskeletal: Negative.    Skin: Negative.    Neurological:  Negative for dizziness.     Medications     Current Outpatient Medications   Medication Sig    aspirin  (ASPIRIN  EC) 81 mg EC tablet Take 1 tablet (81 mg total) by mouth daily.    amLODIPine  (NORVASC ) 5 mg tablet Take 1 tablet (5 mg total) by mouth daily.    metoprolol  tartrate (LOPRESSOR ) 25 mg tablet Take 1 tablet (25 mg total) by mouth 2 times daily as needed (Palpitations, atrial fibrillation).    omeprazole  (PRILOSEC) 40 mg capsule Take 1 capsule (40 mg total) by mouth daily as needed for Gastroesophageal Reflux Disease.    Lidocaine  4 % patch Apply 1 patch onto the skin every 24 hours over  12 hours. to the following areas: left chest. Remove and discard patch within 12 hours or as directed    acetaminophen  (TYLENOL ) 325 mg tablet Take 2 tablets (650 mg total) by mouth every 4-6 hours as needed.    diclofenac  (VOLTAREN ) 1 % gel Apply 4 g to affected area 4 times daily.    CPAP machine ([facility] [phone]) 5-20 cm H2O.  Issue all equip inc. Heated hose/humidifier.  Dx:  OSA  Length:99 (Patient not taking: Reported on 09/28/2023)    EPINEPHrine  (EPIPEN ) 0.3 mg/0.3 mL auto-injector Inject 0.3 mLs (0.3 mg total) into the muscle as needed for Anaphylaxis     Vitals and Physical Exam     Terrell's  height is 1.778 m (5' 10) and weight is 140.6 kg (310 lb) (abnormal). His blood pressure is 140/68 and his pulse is 77. His oxygen saturation is  94%.  Body mass index is 44.48 kg/m.    Physical Exam  Constitutional:       Appearance: He is obese.   Cardiovascular:      Rate and Rhythm: Normal rate and regular rhythm.      Heart sounds: No murmur heard.  Pulmonary:      Effort: Pulmonary effort is normal.      Breath sounds: Normal breath sounds. No wheezing.   Abdominal:      Palpations: Abdomen is soft.   Musculoskeletal:      Cervical back: Neck supple.      Right lower leg: No edema.      Left lower leg: No edema.   Skin:     General: Skin is warm and dry.   Neurological:      General: No focal deficit present.      Mental Status: He is alert.   Psychiatric:         Mood and Affect: Mood normal.       Laboratory Data     Hematology:   Results in Past 730 Days  Result Component Current Result Previous Result   WBC 8.9 (03/30/2024) 11.5 (H) (01/27/2024)   Hemoglobin 15.8 (03/30/2024) 15.3 (01/27/2024)   Hematocrit 48 (03/30/2024) 46 (01/27/2024)   Platelets 362 (03/30/2024) 329 (01/27/2024)     Chemistry:   Results in Past 730 Days  Result Component Current Result Previous Result   Sodium 137 (03/30/2024) 138 (01/27/2024)   Potassium 4.5 (03/30/2024) 3.8 (01/27/2024)   Creatinine 0.76 (03/30/2024) 0.90 (01/27/2024)   Glucose 89 (03/30/2024) 125 (H) (01/27/2024)   Calcium 9.3 (03/30/2024) 8.8 (L) (01/27/2024)     Coagulation Studies:   No results found for requested labs within last 730 days.     Cardiac:   Results in Past 730 Days  Result Component Current Result Previous Result   TROP T 0 HR High Sensitivity <6 (03/30/2024) 6 (01/27/2024)   TROP T 3 HR High Sensitivity <6 (03/30/2024) 7 (01/27/2024)     Lipids:   No results found for requested labs within last 730 days.     Cardiac/Imaging Data & Risk Scores     ECG: sinus rhythm, no interval changes         EXERCISE STRESS ECHO COMPLETE 12/23/2021    Interpretation Summary  1.  Normal LV size, mass, and EF without regional wall motion abnormalities  2.  No significant valvular abnormalities  3.  Normal right heart size and  function with indeterminate pulmonary artery systolic pressure  4.  Reduced functional capacity  5.  Negative exercise stress echo for myocardial  ischemia at the moderate cardiac workload achieved                  CHA2DS-VASC Risk Scores   CHA2DS2-VASC Score: 0 with a risk of ischemic stroke: 0.2%; stroke/TIA/embolism: 0.3%         Impression and Plan     Patient Active Problem List   Diagnosis Code    Other chest pain R07.89    Obesity, unspecified E66.9    Hyperlipidemia, unspecified E78.5    GERD (gastroesophageal reflux disease) K21.9    Prediabetes R73.03    Paroxysmal atrial fibrillation I48.0    Tobacco use Z72.0    OSA (obstructive sleep apnea) G47.33       This is an 38 y.o. male with past medical history as noted above. The following issues were addressed today.    1. Paroxysmal atrial fibrillation.  He has not had recurrent episodes.  He is not on anticoagulation given CHA2DS2-VASc of 0.    2. Hypertension. Goal BP less than 130/80. BP is above goal. Will restart amlodipine  5 mg once daily. Continue lifestyle and dietary interventions.  Reinforced importance of treating OSA. Re-referred to sleep medicine.  Antihypertensive Medications              amLODIPine  (NORVASC ) 5 mg tablet Take 1 tablet (5 mg total) by mouth daily.    metoprolol  tartrate (LOPRESSOR ) 25 mg tablet Take 1 tablet (25 mg total) by mouth 2 times daily as needed (Palpitations, atrial fibrillation).          3.  Hyperlipidemia.  Due for repeat fasting lipid panel.  Reminded him to have this done today.    4.  Morbid obesity.  Discussed importance of losing weight.  Obesity is significantly contributing to negative health outcomes.  Due for A1c could consider GLP-1 with PCP.    5.  Chest pain.  Exercise stress echo negative for ischemia in 2023.  Troponin levels less than 3 in ED most recently.  He has had the same chest pain for 6 years although it is now more frequent.  EKG today shows sinus rhythm.  He does not think the symptoms feel  similar to his episode of atrial fibrillation in the past.  I have asked him to restart amlodipine  5 mg once daily and start walking, working his way up to 30 minutes 5 days a week discussed importance of smoking cessation and getting fasting labs completed that were ordered by Dr. Katharina to further risk stratify.  His chest pain is very atypical for angina and may be secondary to uncontrolled blood pressure, obesity, smoking and acid reflux.  He will try to take his meds regularly. I told him he does not need 650 mg of aspirin  daily. He will reduce to 81 mg .   He will follow-up in 2 to 3 months or sooner if symptoms progress or worsen.  If his pain could consider limited stress echo.    Thank you for the opportunity to participate in the care of your patient. We will plan on seeing back in follow-up in 2-3 months or sooner if needed. Please feel free to contact us  with questions or concerns.          Joesph LITTIE Durand, NP  Electronically signed on 05/05/2024 at 3:15 PM.

## 2024-05-05 NOTE — Patient Instructions (Signed)
 Get your labs drawn, you need to fast 8 hours prior, any [facility] labs  Start walking 30 minutes 5 days a week  Restart amlodipine  5 mg once daily  Call if symptoms progress/worsen

## 2024-05-05 NOTE — Telephone Encounter (Signed)
 Booking Request    Provider to book appt with: Venci     Appt Visit Type needed: 2 months     Diagnosis or history if available (for NPV only):    Timeframe patient needs to be seen in: 2 months     Office preference: any    Who are we contacting with appt information: patient     Best contact phone number: [phone]    Additional comments:

## 2024-05-06 LAB — EKG 12-LEAD
P: 50 deg
PR: 171 ms
QRS: 33 deg
QRSD: 85 ms
QT: 368 ms
QTc: 434 ms
Rate: 83 {beats}/min
T: 32 deg

## 2024-05-08 NOTE — Telephone Encounter (Signed)
 Spoke with patient-scheduled for 8/7 at [facility].

## 2024-06-06 ENCOUNTER — Telehealth: Payer: Self-pay

## 2024-06-06 NOTE — Telephone Encounter (Signed)
 Hi Ann- This patient has a visit with Dr. Garlon on 06/07/24. It looks like he returned his CPAP for non-compliance. Will he need the F2F and in-lab NPSG d/t [employer] insurance? His 2021 study was an HST. Thank you

## 2024-06-07 ENCOUNTER — Ambulatory Visit: Admitting: Sleep Medicine

## 2024-06-08 ENCOUNTER — Encounter: Payer: Self-pay | Admitting: Sleep Medicine

## 2024-06-22 ENCOUNTER — Ambulatory Visit: Admitting: Cardiology

## 2024-06-22 ENCOUNTER — Encounter: Payer: Self-pay | Admitting: Cardiology

## 2024-06-22 ENCOUNTER — Other Ambulatory Visit: Payer: Self-pay

## 2024-06-22 VITALS — BP 134/70 | HR 77 | Ht 70.0 in | Wt 317.0 lb

## 2024-06-22 DIAGNOSIS — R079 Chest pain, unspecified: Secondary | ICD-10-CM

## 2024-06-22 DIAGNOSIS — E785 Hyperlipidemia, unspecified: Secondary | ICD-10-CM

## 2024-06-22 DIAGNOSIS — I10 Essential (primary) hypertension: Secondary | ICD-10-CM

## 2024-06-22 MED ORDER — AMLODIPINE BESYLATE 2.5 MG PO TABS *I*
2.5000 mg | ORAL_TABLET | Freq: Every day | ORAL | 3 refills | Status: AC
Start: 2024-06-22 — End: ?

## 2024-06-22 NOTE — Patient Instructions (Addendum)
 For blood pressure take amlodipine  2.5 mg once per day, every day.  Take the medication every day around the same regardless of how you feelTo see if your chest discomfort symptoms are from acid reflux, start taking omeprazole  40 mg once per day, every day for 2 weeks.

## 2024-06-22 NOTE — Progress Notes (Signed)
 Cardiology Follow-Up VisitName: James Price PCP: Dennise Maudie Caldron, NP DOB: 10-11-1986 Date of Visit: 06/22/2024 History of Present Illness Reason for Visit: Follow-up-atrial fibrillation, hypertensionMr Quinterius Price is a 38 y.o. man with paroxysmal atrial fibrillation, hypertension, morbid obesity, OSA, borderline diabetes, and history of smoking who presents for follow-up.The patient returns for routine follow-up.  At his last visit with me about 9 months ago, he was clinically stable and no changes were made to his medications.  We discussed the importance of weight loss.  He was seen in March and April for chest pain, both unrevealing evaluation including undetectable high-sensitivity troponins.  I asked him to start taking his omeprazole  every day as prescribed.  He was seen by Joesph Durand on 6/20.  At that visit he was asked to start taking amlodipine  and also omeprazole .  Refills were prescribed for both.Today the patient reports continuing to have chest pain episodes.  They occur several times per week without any clear exacerbating factors.  These pains have been ongoing for years without any clear etiology.  They do not seem to be provoked by exertion.  He has not been taking amlodipine  except on occasion when he feels generally off.  He has not been taking omeprazole  and notes that he has never taken omeprazole  consistentlyPast Medical, Surgical, Family and Social History Past Medical History: Diagnosis Date  GERD (gastroesophageal reflux disease)   Obesity   OSA (obstructive sleep apnea) 11/28/2020  Paroxysmal atrial fibrillation   Prediabetes 07/16/2020  Tobacco use 11/04/2020 Past Surgical History: Procedure Laterality Date  ORIF right tibia Right  Family History Problem Relation Name Age of Onset  Heart Disease Mother    High Blood Pressure Mother    Substance use disorder Mother    Heart attack Father    No  Known Problems Sister (5)   No Known Problems Brother (6)   Substance use disorder Maternal Grandmother   Social History Socioeconomic History  Marital status: Single  Number of children: 3 Tobacco Use  Smoking status: Some Days   Packs/day: .5   Types: Cigarettes  Smokeless tobacco: Never Substance and Sexual Activity  Alcohol use: Yes   Comment: 1-2 times month  Drug use: No   Comment: previous use x 3 weeks  Sexual activity: Yes   Partners: Female Social History Narrative  Lives with Dad Review of Systems A 10+ system review was conducted and was negative except as aboveMedications and Allergies Patient's Medications New Prescriptions  No medications on file Previous Medications  ACETAMINOPHEN  (TYLENOL ) 325 MG TABLET    Take 2 tablets (650 mg total) by mouth every 4-6 hours as needed.  ASPIRIN  (ASPIRIN  EC) 81 MG EC TABLET    Take 1 tablet (81 mg total) by mouth daily.  CPAP MACHINE ([facility] [phone])    5-20 cm H2O.  Issue all equip inc. Heated hose/humidifier.  Dx:  OSA  Length:99  DICLOFENAC  (VOLTAREN ) 1 % GEL    Apply 4 g to affected area 4 times daily.  EPINEPHRINE  (EPIPEN ) 0.3 MG/0.3 ML AUTO-INJECTOR    Inject 0.3 mLs (0.3 mg total) into the muscle as needed for Anaphylaxis  LIDOCAINE  4 % PATCH    Apply 1 patch onto the skin every 24 hours over 12 hours. to the following areas: left chest. Remove and discard patch within 12 hours or as directed  METOPROLOL  TARTRATE (LOPRESSOR ) 25 MG TABLET    Take 1 tablet (25 mg total) by mouth 2 times daily as needed (Palpitations, atrial fibrillation).  OMEPRAZOLE  (  PRILOSEC) 40 MG CAPSULE    Take 1 capsule (40 mg total) by mouth daily as needed for Gastroesophageal Reflux Disease. Modified Medications  Modified Medication Previous Medication  AMLODIPINE  (NORVASC ) 2.5 MG TABLET amLODIPine  (NORVASC ) 5 mg tablet     Take 1 tablet (2.5 mg total) by mouth daily.    Take 1 tablet (5 mg total)  by mouth daily. Discontinued Medications  No medications on file Allergies Allergen Reactions  Shellfish-Derived Products Itching   10/16/20: Skin test positive to shrimp, crab, lobster, clam, oyster. Smaller reaction to scallop did just meet criteria for positivity, although he is currently eating scallop without symptoms.  Vitals and Physical Exam VS:  BP 134/70 (BP Location: Left arm, Patient Position: Sitting, Cuff Size: large adult)   Pulse 77   Ht 1.778 m (5' 10)   Wt (!) 143.8 kg (317 lb)   SpO2 99%   BMI 45.48 kg/m  Gen:  Appears comfortableHENT:  NC/AT, neck supple, JVP not distendedResp:  CTACV:  RRR, . No murmurs, rubs, or gallops. No lower extremity edemaGI:  Abdomen is soft, non-tender, non-distendedMsk:  No cyanosis, no clubbingSkin:  No rashesLaboratory Data Lab Results Component Value Date  CREAT 0.76 03/30/2024  UN 7 03/30/2024  NA 137 03/30/2024  K 4.5 03/30/2024  CL 102 03/30/2024  CO2 27 03/30/2024 No results for input(s): CHOL, HDL, LDLC, LDL, TRIG, NHDLC, CHHDC in the last 8760 hours.Lab Results Component Value Date  HA1C 6.1 (H) 07/12/2020 Cardiac/Imaging Data  EXERCISE STRESS ECHO COMPLETE 02/07/2023Interpretation Summary1.  Normal LV size, mass, and EF without regional wall motion abnormalities2.  No significant valvular abnormalities3.  Normal right heart size and function with indeterminate pulmonary artery systolic pressure4.  Reduced functional capacity5.  Negative exercise stress echo for myocardial ischemia at the moderate cardiac workload achievedNo orders of the defined types were placed in this encounter.Impression & Plan 39 y.o. man with paroxysmal atrial fibrillation, hypertension, morbid obesity, OSA, borderline diabetes, and history of smoking who presents for follow-up.Chest pain symptoms: Remain atypical.  Frequently reoccurring  and present over years duration.  ECG stable.  High-sensitivity troponins on multiple occasions normal/undetectable.  As a trial I asked him to take omeprazole  every day as prescribed for 2 weeks to see if the pains completely resolve or not.  He agrees to do so.Paroxysmal AF: Currently sinus rhythm.  Occasionally feels palpitations. CHADS2-VASc is 0 (does not reach HTN point threshold).Hypertension: BP only appearing modestly elevated of late.  He has not been consistently taking amlodipine .  I asked him to start amlodipine  at a lower dose 2.5 mg daily and take it every day as prescribed.  I reinforced that he should not decide to take the amlodipine  based on how he feels, but rather taking it every day and tracking blood pressure control over time.Hyperlipidemia: Did not go for labs.  Needs updated lipid profile.Morbid obesity: Work on diet and lifestyle measures.OSA: Lost CPAP machine.  He did not routinely use it when it was available.  I will plan on following up with James Snellings again in 3 months.  Thank you for allowing me the opportunity to participate in the care of your patient.  Mabel CHRISTELLA Snipe, MDCardiovascular [facility]

## 2024-06-22 NOTE — Addendum Note (Signed)
 Addended by: KATHARINA MABEL HERO on: 06/22/2024 03:09 PM Modules accepted: Level of Service

## 2024-08-25 ENCOUNTER — Encounter: Payer: Self-pay | Admitting: Internal Medicine

## 2024-10-04 ENCOUNTER — Encounter: Payer: Self-pay | Admitting: Cardiology

## 2024-10-04 ENCOUNTER — Telehealth: Payer: Self-pay | Admitting: Cardiology

## 2024-10-04 ENCOUNTER — Ambulatory Visit: Payer: PRIVATE HEALTH INSURANCE | Admitting: Cardiology

## 2024-10-04 NOTE — Telephone Encounter (Signed)
 Patient missed a follow up appointment with Dr.Venci on 10/04/24.Rescheduled missed appointment with patient. Sent out appointment reminder letter.No show letter sent out. DONE!

## 2024-11-14 ENCOUNTER — Other Ambulatory Visit: Payer: Self-pay | Admitting: Cardiology

## 2024-11-14 DIAGNOSIS — K219 Gastro-esophageal reflux disease without esophagitis: Secondary | ICD-10-CM

## 2024-11-14 MED ORDER — OMEPRAZOLE 40 MG PO CPDR *I*: 40.0000 mg | DELAYED_RELEASE_CAPSULE | Freq: Every day | ORAL | 3 refills | Status: AC | PRN

## 2024-11-29 ENCOUNTER — Other Ambulatory Visit: Payer: Self-pay

## 2024-11-29 MED ORDER — OMEPRAZOLE 40 MG PO CPDR *I*
DELAYED_RELEASE_CAPSULE | ORAL | 3 refills | Status: AC
  Filled 2024-11-29 – 2024-12-12 (×2): qty 90, 90d supply, fill #0

## 2024-11-30 ENCOUNTER — Other Ambulatory Visit: Payer: Self-pay

## 2024-12-11 ENCOUNTER — Other Ambulatory Visit: Payer: Self-pay

## 2024-12-12 ENCOUNTER — Other Ambulatory Visit: Payer: Self-pay

## 2024-12-21 ENCOUNTER — Telehealth: Payer: Self-pay | Admitting: Cardiology

## 2024-12-21 ENCOUNTER — Encounter: Payer: Self-pay | Admitting: Cardiology

## 2024-12-21 ENCOUNTER — Ambulatory Visit: Payer: PRIVATE HEALTH INSURANCE | Admitting: Cardiology

## 2024-12-21 NOTE — Telephone Encounter (Signed)
 Pt no show on 12/21/2024. Called/rescheduled per pt states he didn't remember today's appointment. Letter sent

## 2025-01-01 ENCOUNTER — Ambulatory Visit: Payer: PRIVATE HEALTH INSURANCE | Admitting: Cardiology
